# Patient Record
Sex: Male | Born: 1945 | Race: White | Hispanic: No | Marital: Married | Smoking: Former smoker
Health system: Southern US, Community
[De-identification: ages and names within clinical notes are randomized; demographics above are authoritative.]

## PROBLEM LIST (undated history)

## (undated) DIAGNOSIS — E782 Mixed hyperlipidemia: Secondary | ICD-10-CM

## (undated) DIAGNOSIS — D369 Benign neoplasm, unspecified site: Secondary | ICD-10-CM

## (undated) DIAGNOSIS — N179 Acute kidney failure, unspecified: Secondary | ICD-10-CM

## (undated) DIAGNOSIS — I10 Essential (primary) hypertension: Secondary | ICD-10-CM

## (undated) DIAGNOSIS — Z87448 Personal history of other diseases of urinary system: Secondary | ICD-10-CM

## (undated) DIAGNOSIS — I2510 Atherosclerotic heart disease of native coronary artery without angina pectoris: Secondary | ICD-10-CM

## (undated) DIAGNOSIS — J449 Chronic obstructive pulmonary disease, unspecified: Secondary | ICD-10-CM

## (undated) HISTORY — DX: Personal history of other diseases of urinary system: Z87.448

## (undated) HISTORY — PX: CHOLECYSTECTOMY: SHX55

## (undated) HISTORY — PX: TONSILLECTOMY AND ADENOIDECTOMY: SHX28

## (undated) HISTORY — DX: Mixed hyperlipidemia: E78.2

## (undated) HISTORY — DX: Acute kidney failure, unspecified: N17.9

## (undated) HISTORY — PX: VASECTOMY: SHX75

## (undated) HISTORY — DX: Benign neoplasm, unspecified site: D36.9

## (undated) HISTORY — DX: Chronic obstructive pulmonary disease, unspecified: J44.9

## (undated) HISTORY — DX: Atherosclerotic heart disease of native coronary artery without angina pectoris: I25.10

## (undated) HISTORY — DX: Essential (primary) hypertension: I10

## (undated) HISTORY — PX: LAPAROSCOPIC APPENDECTOMY: SUR753

---
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
## 2005-03-17 ENCOUNTER — Ambulatory Visit: Payer: Self-pay | Admitting: Internal Medicine

## 2005-03-22 ENCOUNTER — Ambulatory Visit: Payer: Self-pay | Admitting: Cardiology

## 2005-04-01 ENCOUNTER — Ambulatory Visit: Payer: Self-pay | Admitting: Internal Medicine

## 2005-04-06 ENCOUNTER — Ambulatory Visit: Payer: Self-pay | Admitting: Cardiology

## 2005-04-06 ENCOUNTER — Ambulatory Visit (HOSPITAL_COMMUNITY): Admission: RE | Admit: 2005-04-06 | Discharge: 2005-04-07 | Payer: Self-pay | Admitting: Cardiology

## 2005-04-06 ENCOUNTER — Inpatient Hospital Stay (HOSPITAL_BASED_OUTPATIENT_CLINIC_OR_DEPARTMENT_OTHER): Admission: RE | Admit: 2005-04-06 | Discharge: 2005-04-06 | Payer: Self-pay | Admitting: Cardiology

## 2005-04-22 ENCOUNTER — Ambulatory Visit: Payer: Self-pay | Admitting: Cardiology

## 2005-05-13 ENCOUNTER — Ambulatory Visit: Payer: Self-pay | Admitting: Cardiology

## 2009-12-15 ENCOUNTER — Encounter: Payer: Self-pay | Admitting: Internal Medicine

## 2009-12-16 ENCOUNTER — Telehealth (INDEPENDENT_AMBULATORY_CARE_PROVIDER_SITE_OTHER): Payer: Self-pay

## 2009-12-29 ENCOUNTER — Ambulatory Visit: Payer: Self-pay | Admitting: Internal Medicine

## 2009-12-29 ENCOUNTER — Ambulatory Visit (HOSPITAL_COMMUNITY): Admission: RE | Admit: 2009-12-29 | Discharge: 2009-12-29 | Payer: Self-pay | Admitting: Internal Medicine

## 2009-12-30 ENCOUNTER — Encounter: Payer: Self-pay | Admitting: Internal Medicine

## 2010-10-26 NOTE — Letter (Signed)
Summary: Patient Notice, Colon Biopsy Results  [facility]  [address]   [place], [place] [address]   Phone: [phone]  Fax: [phone]       December 30, 2009   [facility] [place] [address] [place], [place]  [address] 10/01/1945    Dear Hector Daniels,  I am pleased to inform you that the biopsies taken during your recent colonoscopy did not show any evidence of cancer upon pathologic examination.  Additional information/recommendations:  You should have a repeat colonoscopy examination  in 7 years.  Please call us if you are having persistent problems or have questions about your condition that have not been fully answered at this time.  Sincerely,    R. Roetta Sessions MD, FACP [facility] Ph: [phone]    Fax: [phone]   Appended Document: Patient Notice, Colon Biopsy Results Letter mailed to pt.

## 2010-10-26 NOTE — Progress Notes (Signed)
Summary: reviewed TCS instructions  Phone Note Call from Patient   Caller: Patient Summary of Call: Pt called and i reviewed instructions with him, that had been faxed to [facility]. Initial call taken by: Cloria Spring LPN,  December 16, 2009 9:33 AM

## 2010-10-26 NOTE — Letter (Signed)
Summary: Internal Other/triage  Internal Other/triage   Imported By: Cloria Spring LPN 62/95/2841 32:44:01  _____________________________________________________________________  External Attachment:    Type:   Image     Comment:   External Document

## 2011-02-11 NOTE — Cardiovascular Report (Signed)
Hector, Daniels NO.:  [id number]   MEDICAL RECORD NO.:  [id number]          PATIENT TYPE:  OIB   LOCATION:  6501                         FACILITY:  [facility]   PHYSICIAN:  Jonelle Sidle, M.D. LHCDATE OF BIRTH:  Jun 18, 1946   DATE OF PROCEDURE:  04/06/2005  DATE OF DISCHARGE:                              CARDIAC CATHETERIZATION   REQUESTING CARDIOLOGIST:  Dr. Arvilla Meres.   PRIMARY CARE PHYSICIAN:  Dr. Wyvonnia Lora, [place], [place].   INDICATIONS:  Hector Daniels is a pleasant 65 year old male with a history of  hypertension, hyperlipidemia, remote tobacco use and recent complaints of  exertional chest discomfort.  He underwent an exercise Cardiolite on June 27  which was abnormal both electrocardiographically and also by perfusion  imaging showing a large inferolateral defect that was essentially fixed with  a reversible periapical defect in the septal and anterolateral distribution.  Ejection fraction was 55%.  He is now referred for diagnostic coronary  angiography to evaluate for revascularization strategies.   PROCEDURES PERFORMED:  1.  Left heart catheterization.  2.  Selective coronary angiography.  3.  Left ventriculography.   ACCESS AND EQUIPMENT:  The area about the right femoral artery was  anesthetized with 1% lidocaine and a 4-French sheath was placed in the right  femoral artery via modified Seldinger technique.  Standard preformed 4-  [place] JL-4 and JR-4 catheters were used for selective coronary angiography  and an angled pigtail catheter was used for left heart catheterization and  left ventriculography.  All exchanges were made over a wire and the patient  tolerated procedure well without immediate complications.   HEMODYNAMICS:  Left ventricle 157/20 mmHg, aorta 156/72 mmHg.   ANGIOGRAPHIC FINDINGS:  1.  The left main coronary artery exhibits approximately 40% narrowing      distally which also effects the ostial portion of  the left anterior      descending.  2.  The left anterior descending is a medium caliber vessel with two      diagonal branches, the first of which is large.  There is approximately      40-50% stenosis in the proximal to midportion of the left anterior      descending also involving the ostium of the first diagonal branch.  In      the distal to apical portion of the left anterior descending there are      focal 50% and 75-80% stenoses.  These are predominantly affecting the      apical portion of this vessel.  3.  The circumflex coronary artery is medium in caliber.  There are two      obtuse marginal branches, the second of which is larger and bifurcates.      In the proximal vessel there is approximately 50-60% stenosis.  The      first obtuse marginal branch has approximately 50-60% stenosis at its      ostium.  In the mid circumflex there is 30% stenosis.  4.  The right coronary artery is a relatively large caliber vessel that is  dominant.  In the proximal portion of the vessel there is a 75% focal      stenosis.  The distal portion of the vessel has a 90% focal stenosis.      Other more minor luminal irregularities are also noted.   Left ventriculography was performed in the RAO projection and revealed an  ejection fraction of approximately 55% with mild inferior basal hypokinesis  and no significant mitral regurgitation.   DIAGNOSES:  1.  Multivessel coronary atherosclerosis as outlined, most significant in      the right coronary artery with focal proximal 75% and distal 90%      stenoses.  The left system has less severe stenoses.  The most      significant involved the apical left anterior descending.  2.  Left ventricular ejection fraction approximately 55% with mild inferior      basal hypokinesis, no significant mitral regurgitation and a left      ventricle end-diastolic pressure of 20 mmHg.   DISCUSSION:  I reviewed the films with Dr. Riley Kill and discussed the   situation with the patient and his family.   PLAN:  At this point will be to proceed with percutaneous intervention  likely with stent placement to the proximal and distal right coronary  artery, anticipating medical therapy for the left system disease for the  time being.        SGM/MEDQ  D:  04/06/2005  T:  04/06/2005  Job:  [id number]   cc:   Arvilla Meres, M.D. [facility]   Wyvonnia Lora, M.D.

## 2011-02-11 NOTE — Cardiovascular Report (Signed)
Hector Daniels, Hector Daniels                  ACCOUNT NO.:  [id number]   MEDICAL RECORD NO.:  [id number]          PATIENT TYPE:  OIB   LOCATION:  6532                         FACILITY:  [facility]   PHYSICIAN:  Arturo Morton. Riley Kill, M.D. [facility] OF BIRTH:  May 06, 1946   DATE OF PROCEDURE:  DATE OF DISCHARGE:                              CARDIAC CATHETERIZATION   INDICATIONS:  Hector Daniels is a 64 year old gentleman with exertional angina.  He underwent catheterization by Dr. Diona Browner.  This demonstrated a fairly  high-grade tandem lesions of the right coronary artery.  There is scattered  disease of the left which is outlined in the catheterization report.  With  review of the films percutaneous intervention of the RCA with continued  medical therapy for the remaining disease was felt to be indicated.  Risks,  benefits, and alternatives were discussed with the patient and his family in  detail prior to the procedure.   PROCEDURE:  Percutaneous stenting of the right coronary artery x2.   DESCRIPTION OF PROCEDURE:  The patient was brought to the catheterization  laboratory from the holding area, prepped and draped in usual fashion.  The  4-French sheath was exchanged for a 6-French sheath by the technologists.  The patient had an ACT checked and subsequently was given bivalirudin  according to protocol.  ACT was checked and found to be appropriate for  intervention.  JR4 guiding catheter with side holes was then utilized.  We  crossed into the distal lesion across both lesions using a high-torque  floppy wire.  The distal stenosis was dilated with a 2.5 x 12 Quantum  Maverick balloon.  There was improvement in the appearance of the artery.  This was then stented using a 2.75 x 16 TAXUS drug-eluting stent.  We were  careful not to extend more proximally into an area of segmental plaquing,  and the stent was taken up to about 14 atmospheres.  Post dilatation with a  3 mm balloon throughout the course of the  stent was then performed using a  PowerSail.  The PowerSail was then used to pre dilate the mid lesion.  Following pre dilatation the mid lesion was then stented with a 3 x 16 TAXUS  drug-eluting stent.  We elected not to use a 3.5 because of the segmental  nature the plaque proximal to the greatest area of stenosis.  This was taken  up to also 14 atmospheres.  We then put in a 3.75 PowerSail balloon 8 mm in  length.  This was taken to the distal end of the stent and taken up to about  14 atmospheres.  It was then brought to the middle the stent were a second  dilatation was performed and the proximal portion of the stent where a third  dilatation was performed at slightly lower pressures.  Following this,  follow-up views were obtained.  We went back in and dilated the middle of  the stent with a 14 atmosphere inflation.  The balloon was then removed.  Final views were obtained.  I then reviewed pictures with the patient's  family  in detail.   ANGIOGRAPHIC DATA:  The right coronary artery demonstrates a 70% hazy-  appearing stenosis in the mid portion of the vessel.  Following the acute  margin there is an area of segmental plaquing of about 30-40%.  There is  then a 95% focal stenosis.  The posterolateral branch has about a 50% area  of narrowing.   The 95% distal focal stenosis was reduced to 0%.  The edges appeared to be  excellent without any evidence of edge tear.  The mid stenosis was 70% and  hazy and was reduced to 0% with an excellent angiographic appearance and no  evidence of edge tear.   RECOMMENDATIONS:  1.  Continue clopidogrel for one year and aspirin indefinitely thereafter.  2.  Follow up with Dr. Diona Browner and Dr. Margo Common in [place].  3.  Aggressive medical therapy.  4.  I have discussed the patient's use of smokeless tobacco in some detail      with him, explained the mechanisms of its impact on vascular disease,      and explained to him why we thought that it would be  wise for him to      stop this.       TDS/MEDQ  D:  04/06/2005  T:  04/06/2005  Job:  [id number]   cc:   CV Lab   Hector Daniels, M.D. [facility]  [address]  [place]  [place] [address]  Fax: [phone]

## 2011-02-11 NOTE — Discharge Summary (Signed)
Hector Daniels, Hector Daniels                  ACCOUNT NO.:  [id number]   MEDICAL RECORD NO.:  [id number]          PATIENT TYPE:  OIB   LOCATION:  6532                         FACILITY:  [facility]   PHYSICIAN:  Arturo Morton. Riley Kill, M.D. [facility] OF BIRTH:  07-Aug-1946   DATE OF ADMISSION:  04/06/2005  DATE OF DISCHARGE:  04/07/2005                                 DISCHARGE SUMMARY   PROCEDURES:  1.  Cardiac catheterization.  2.  Coronary arteriogram.  3.  Left ventriculogram.  4.  Recatheterization with percutaneous intervention and Taxus stent x 2 to      one vessel.   DISCHARGE DIAGNOSES:  1.  Exertional chest pain, status post catheterization showing 75% mid and      90% distal right coronary artery treated with two Taxus stents and      multivessel nonobstructive disease, medical therapy.  2.  Preserved left ventricular function with an ejection fraction of 55% at      catheterization.  3.  Hypertension.  4.  Hyperlipidemia.  5.  Ongoing tobacco use with chewing tobacco.  6.  A 25-pack-year history of tobacco use.  7.  Obesity.  8.  Status post vasectomy and tonsillectomy.   HOSPITAL COURSE:  Hector Daniels this is a 65 year old male with multiple  cardiac risk factors, who had a stress test on March 22, 2005. He was seen in  the office after that because of a large inferolateral defect, as well as a  periapical defect. The periapical defect had reversibility and it was felt  that he needed a heart catheterization. He was admitted for this on April 06, 2005.   The cardiac catheterization showed 75% mid RCA and 90% distal RCA. This was  treated with Taxus stents x 2. The circumflex, OM and LAD had between 30-60%  stenosis with 75% distal LAD seen as well. This is to be treated medically.   The next day, Hector Daniels was ambulating without chest pain or shortness of  breath. He was encouraged to quit chewing tobacco and said he would do so. A  nutrition consult was ordered, as well as cardiac  rehabilitation. Hector Daniels  is pending evaluation by Dr. Riley Kill, but tentatively considered stable for  discharge on April 07, 2005.   DISCHARGE INSTRUCTIONS:  1.  His activity level is to be per the cardiac catheterization discharge      sheet.  2.  He is to call our office for problems with the catheterization site.  3.  He is to stick to a low-fat diet.  4.  He is to follow up with Dr. Margo Common as needed.  5.  He has an appointment with Hector Conners. Julious Oka., for Dr. Gala Romney on      April 19, 2005, at 10:45 a.m.   DISCHARGE MEDICATIONS:  1.  Plavix 75 mg daily.  2.  Aspirin 325 mg daily.  3.  Toprol XL 50 mg daily.  4.  Crestor 10 mg daily.  5.  Lisinopril 20 mg daily.  6.  Prilosec 20 mg daily.  7.  Nitroglycerin sublingual  p.r.n.  8.  Isosorbide as prior to admission.       RB/MEDQ  D:  04/07/2005  T:  04/07/2005  Job:  [id number]   cc:   [facility]  [place], [place]   Wyvonnia Lora  [address]  [place]  [place] [address]  Fax: [phone]

## 2012-09-13 ENCOUNTER — Encounter: Payer: Self-pay | Admitting: Cardiology

## 2012-09-14 ENCOUNTER — Encounter: Payer: Self-pay | Admitting: Cardiology

## 2012-09-28 ENCOUNTER — Encounter: Payer: Self-pay | Admitting: Cardiology

## 2012-10-08 ENCOUNTER — Encounter: Payer: Self-pay | Admitting: Cardiology

## 2012-10-17 ENCOUNTER — Encounter: Payer: Self-pay | Admitting: Cardiology

## 2012-10-18 ENCOUNTER — Telehealth: Payer: Self-pay | Admitting: Cardiology

## 2012-10-18 ENCOUNTER — Encounter: Payer: Self-pay | Admitting: *Deleted

## 2012-10-18 ENCOUNTER — Ambulatory Visit (INDEPENDENT_AMBULATORY_CARE_PROVIDER_SITE_OTHER): Payer: Medicare Other | Admitting: Cardiology

## 2012-10-18 ENCOUNTER — Encounter: Payer: Self-pay | Admitting: Cardiology

## 2012-10-18 ENCOUNTER — Other Ambulatory Visit: Payer: Self-pay | Admitting: *Deleted

## 2012-10-18 VITALS — BP 187/107 | HR 69 | Ht 70.0 in | Wt 209.8 lb

## 2012-10-18 DIAGNOSIS — I10 Essential (primary) hypertension: Secondary | ICD-10-CM

## 2012-10-18 DIAGNOSIS — Z87448 Personal history of other diseases of urinary system: Secondary | ICD-10-CM

## 2012-10-18 DIAGNOSIS — I429 Cardiomyopathy, unspecified: Secondary | ICD-10-CM

## 2012-10-18 DIAGNOSIS — I2510 Atherosclerotic heart disease of native coronary artery without angina pectoris: Secondary | ICD-10-CM

## 2012-10-18 DIAGNOSIS — E782 Mixed hyperlipidemia: Secondary | ICD-10-CM

## 2012-10-18 MED ORDER — HYDRALAZINE HCL 25 MG PO TABS: 25.0000 mg | ORAL_TABLET | Freq: Three times a day (TID) | ORAL | Status: DC

## 2012-10-18 NOTE — Assessment & Plan Note (Signed)
History of DES x 2 to the RCA in 2006 with moderate left system disease that was managed medically at that time. Main question at this point with associated cardiomyopathy is whether he has manifested significant obstruction over the years. Followup testing arranged.

## 2012-10-18 NOTE — Progress Notes (Signed)
Clinical Summary Mr. Hector Daniels is a 67 y.o.male referred to the office by Dr. Margo Common for evaluation of known CAD and documentation of cardiomyopathy. He was seen by our practice in the past, status post intervention to the RCA in 2006 as detailed below.  Recent echocardiogram from 10/08/12 revealed LVEF 40-45% with inferior wall motion abnormality consistent with ischemic cardiomyopathy, trace tricuspid regurgitation, RVSP 25 mm mercury. LV function had been normal around the time of his intervention in 2006.  He is significantly hypertensive today, I note that he was taken off ACE inhibitor and diuretic in December in the setting of acute renal insufficiency (prerenal), subsequently normalized creatinine to 0.9. He states that he has started to feel more weak with activity back in November, also intermittent cough, decreased appetite. His renal function improved with hydration, he has followup with Dr. Kristian Covey soon for followup BMET. He has gone back to his walking regimen, only about a mile at a time indoors. Prior to that he was walking 3 miles at a time outdoors. He denies any obvious angina symptoms.  No followup ischemic testing noted since cardiac catheterization in 2006.  Allergies  Allergen Reactions  . Crestor (Rosuvastatin)     Right hip pain and weakness  . Tessalon (Benzonatate)     Weird feeling     Current Outpatient Prescriptions  Medication Sig Dispense Refill  . aspirin 325 MG tablet Take 325 mg by mouth daily.      . clopidogrel (PLAVIX) 75 MG tablet Take 75 mg by mouth daily.      . metoprolol (LOPRESSOR) 50 MG tablet Take 75 mg by mouth 2 (two) times daily.      . Multiple Vitamin (MULTIVITAMIN) capsule Take 1 capsule by mouth daily.      . ranitidine (ZANTAC) 75 MG tablet Take 150 mg by mouth 2 (two) times daily.      . sildenafil (VIAGRA) 100 MG tablet Take 100 mg by mouth daily as needed.      . simvastatin (ZOCOR) 40 MG tablet Take 40 mg by mouth every evening.       . hydrALAZINE (APRESOLINE) 25 MG tablet Take 1 tablet (25 mg total) by mouth 3 (three) times daily.  270 tablet  3    Past Medical History  Diagnosis Date  . Essential hypertension, benign   . Mixed hyperlipidemia   . Coronary atherosclerosis of native coronary artery     Moderate multivessel, s/p DES x 2 RCA 2006  . Tubular adenoma   . History of hematuria   . Acute renal failure     Episode 12/13, prerenal, ACE inhibitor and diuretic discontinued    Past Surgical History  Procedure Date  . Tonsillectomy and adenoidectomy   . Vasectomy   . Laparoscopic appendectomy     Family History  Problem Relation Age of Onset  . Melanoma Father     Cause of death, age 63  . Heart disease Mother     Died age 97  . Hyperlipidemia Brother     Social History Mr. Glazier reports that he quit smoking about 8 years ago. His smoking use included Cigarettes. He quit after 10 years of use. He does not have any smokeless tobacco history on file. Mr. Filip reports that he drinks alcohol.  Review of Systems No palpitations or syncope. No recent fevers or chills, no active cough. Mild ankle edema. Appetite has been good. Normal bowel pattern. No orthopnea or PND. Otherwise negative.  Physical Examination Filed  Vitals:   10/18/12 0844  BP: 187/107  Pulse:    Filed Weights   10/18/12 0836  Weight: 209 lb 12.8 oz (95.165 kg)   Overweight male in no acute distress. HEENT: Conjunctiva and lids normal, oropharynx clear with moist mucosa. Neck: Supple, no elevated JVP or carotid bruits, no thyromegaly. Lungs: Clear to auscultation, nonlabored breathing at rest. Cardiac: Regular rate and rhythm, no S3 or significant systolic murmur, no pericardial rub. Abdomen: Soft, nontender, bowel sounds present, no guarding or rebound. Extremities: Trace edema, distal pulses 2+. Skin: Warm and dry. Musculoskeletal: No kyphosis. Neuropsychiatric: Alert and oriented x3, affect grossly  appropriate.   Problem List and Plan   Secondary cardiomyopathy LVEF down to the range of 40-45% with inferior wall motion abnormality consistent with ischemic cardiomyopathy. LV function had been normal around the time of his intervention to the RCA in 2006. He has had some functional limitations over the last few months although not clear if he had a viral syndrome associated. He has otherwise been compliant with medical therapy and typically tries to stay active via walking. His blood pressure is significantly elevated, although he was taken off ACE inhibitor and diuretic in December. Plan is to add hydralazine 25 mg 3 times a day for now, and arrange a Lexiscan Cardiolite on medical therapy to assess ischemic burden. Office followup will be arranged to review.  History of acute renal failure Pending followup visit with Dr. Kristian Covey. He was felt to be prerenal, creatinine normalized with hydration. Not clear whether he is to stay off ACE inhibitor indefinitely. If ACE inhibitor or ARB could be resumed, I would place hydralazine with it.  Mixed hyperlipidemia On statin therapy, followed by Dr. Margo Common.  Essential hypertension, benign For now we are adding hydralazine 25 mg 3 times a day until issue of ACE inhibitor or ARB is clarified.  Coronary atherosclerosis of native coronary artery History of DES x 2 to the RCA in 2006 with moderate left system disease that was managed medically at that time. Main question at this point with associated cardiomyopathy is whether he has manifested significant obstruction over the years. Followup testing arranged.    Jonelle Sidle, M.D., F.A.C.C.

## 2012-10-18 NOTE — Patient Instructions (Addendum)
Your physician recommends that you schedule a follow-up appointment in: 1 month. Your physician has recommended you make the following change in your medication: Start hydralazine 25 mg by mouth three times daily. Your new prescription has been sent to your pharmacy. All other medications will remain the same. Your physician has requested that you have a lexiscan myoview. For further information please visit [email or web address] Please follow instruction sheet, as given.

## 2012-10-18 NOTE — Assessment & Plan Note (Signed)
For now we are adding hydralazine 25 mg 3 times a day until issue of ACE inhibitor or ARB is clarified.

## 2012-10-18 NOTE — Assessment & Plan Note (Signed)
On statin therapy, followed by Dr. Margo Common.

## 2012-10-18 NOTE — Assessment & Plan Note (Signed)
Pending followup visit with Dr. Kristian Covey. He was felt to be prerenal, creatinine normalized with hydration. Not clear whether he is to stay off ACE inhibitor indefinitely. If ACE inhibitor or ARB could be resumed, I would place hydralazine with it.

## 2012-10-18 NOTE — Telephone Encounter (Signed)
Lexiscan Cardiolite on meds  Diagnosis: 414.01, 425.4 Scheduled 10-25-12 [facility] checking percert

## 2012-10-18 NOTE — Assessment & Plan Note (Signed)
LVEF down to the range of 40-45% with inferior wall motion abnormality consistent with ischemic cardiomyopathy. LV function had been normal around the time of his intervention to the RCA in 2006. He has had some functional limitations over the last few months although not clear if he had a viral syndrome associated. He has otherwise been compliant with medical therapy and typically tries to stay active via walking. His blood pressure is significantly elevated, although he was taken off ACE inhibitor and diuretic in December. Plan is to add hydralazine 25 mg 3 times a day for now, and arrange a Lexiscan Cardiolite on medical therapy to assess ischemic burden. Office followup will be arranged to review.

## 2012-10-22 NOTE — Telephone Encounter (Signed)
Pt has Blue MCR.  ZOXW#[id number] expires 11-20-12

## 2012-10-25 DIAGNOSIS — I2510 Atherosclerotic heart disease of native coronary artery without angina pectoris: Secondary | ICD-10-CM

## 2012-10-30 ENCOUNTER — Telehealth: Payer: Self-pay | Admitting: *Deleted

## 2012-10-30 NOTE — Telephone Encounter (Signed)
Message copied by Eustace Moore on Tue Oct 30, 2012 11:42 AM ------      Message from: MCDOWELL, Illene Bolus      Created: Fri Oct 26, 2012 12:13 PM       Study shows no definite ischemia, possible scar in the inferior wall versus attenuation. Interestingly, LVEF is in the normal range by this study at 59%. I expect that we can continue medical therapy. He should already have a followup arranged.

## 2012-10-30 NOTE — Telephone Encounter (Signed)
Patient informed. 

## 2012-11-30 ENCOUNTER — Ambulatory Visit: Payer: Medicare Other | Admitting: Cardiology

## 2013-01-21 ENCOUNTER — Encounter: Payer: Self-pay | Admitting: Cardiology

## 2013-01-21 ENCOUNTER — Ambulatory Visit (INDEPENDENT_AMBULATORY_CARE_PROVIDER_SITE_OTHER): Payer: Medicare Other | Admitting: Cardiology

## 2013-01-21 VITALS — BP 157/72 | HR 68 | Ht 70.0 in | Wt 203.0 lb

## 2013-01-21 DIAGNOSIS — I2510 Atherosclerotic heart disease of native coronary artery without angina pectoris: Secondary | ICD-10-CM

## 2013-01-21 DIAGNOSIS — I10 Essential (primary) hypertension: Secondary | ICD-10-CM

## 2013-01-21 DIAGNOSIS — E782 Mixed hyperlipidemia: Secondary | ICD-10-CM

## 2013-01-21 DIAGNOSIS — I429 Cardiomyopathy, unspecified: Secondary | ICD-10-CM

## 2013-01-21 NOTE — Assessment & Plan Note (Signed)
Blood pressure trend is better. Continue current regimen.

## 2013-01-21 NOTE — Assessment & Plan Note (Signed)
Symptomatically stable on medical therapy with history of previous intervention to the RCA as outlined. Followup Cardiolite in January demonstrated inferior scar with no active ischemia, LVEF was 59% at that point. Plan to continue medical therapy and observation. Six-month followup arranged.

## 2013-01-21 NOTE — Assessment & Plan Note (Signed)
LVEF by Cardiolite in January was 59%. Continue medical therapy.

## 2013-01-21 NOTE — Assessment & Plan Note (Signed)
Keep followup with Dr. Margo Common. He continues on Zocor. Goal LDL should be under 100, closer to 70 if possible.

## 2013-01-21 NOTE — Progress Notes (Signed)
Clinical Summary Hector Daniels is a 67 y.o.male last seen in January. He states that he has been doing well, back to walking 3 miles at a time, 5 days a week. He reports no angina symptoms, stable NYHA class II dyspnea. He has maintained followup with nephrology, states that things have been improving.  Lexiscan Cardiolite in January demonstrated no diagnostic ST segment changes with evidence of inferior scar, no active ischemia, LVEF 59%. We discussed this today. Plan will be to continue medical therapy for now.  Blood pressure trend is much better, he is tolerating hydralazine.   Allergies  Allergen Reactions  . Crestor (Rosuvastatin)     Right hip pain and weakness  . Tessalon (Benzonatate)     Weird feeling     Current Outpatient Prescriptions  Medication Sig Dispense Refill  . aspirin 325 MG tablet Take 325 mg by mouth daily.      . clopidogrel (PLAVIX) 75 MG tablet Take 75 mg by mouth daily.      . hydrALAZINE (APRESOLINE) 25 MG tablet Take 25 mg by mouth 2 (two) times daily.      . hydrochlorothiazide (MICROZIDE) 12.5 MG capsule Take 1 capsule by mouth daily.      . metoprolol (LOPRESSOR) 50 MG tablet Take 75 mg by mouth 2 (two) times daily.      . Multiple Vitamin (MULTIVITAMIN) capsule Take 1 capsule by mouth daily.      . ranitidine (ZANTAC) 75 MG tablet Take 150 mg by mouth 2 (two) times daily.      . sildenafil (VIAGRA) 100 MG tablet Take 100 mg by mouth daily as needed.      . simvastatin (ZOCOR) 40 MG tablet Take 40 mg by mouth every evening.       No current facility-administered medications for this visit.    Past Medical History  Diagnosis Date  . Essential hypertension, benign   . Mixed hyperlipidemia   . Coronary atherosclerosis of native coronary artery     Moderate multivessel, s/p DES x 2 RCA 2006  . Tubular adenoma   . History of hematuria   . Acute renal failure     Episode 12/13, prerenal, ACE inhibitor and diuretic discontinued    Social  History Hector Daniels reports that he quit smoking about 8 years ago. His smoking use included Cigarettes. He smoked 0.00 packs per day for 10 years. He has never used smokeless tobacco. Hector Daniels reports that  drinks alcohol.  Review of Systems No palpitations, breathlessness, syncope. Stable appetite. No orthopnea or PND. Otherwise negative.  Physical Examination Filed Vitals:   01/21/13 1320  BP: 157/72  Pulse: 68   Filed Weights   01/21/13 1316  Weight: 203 lb (92.08 kg)    Overweight male in no acute distress.  HEENT: Conjunctiva and lids normal, oropharynx clear with moist mucosa.  Neck: Supple, no elevated JVP or carotid bruits, no thyromegaly.  Lungs: Clear to auscultation, nonlabored breathing at rest.  Cardiac: Regular rate and rhythm, no S3 or significant systolic murmur, no pericardial rub.  Abdomen: Soft, nontender, bowel sounds present, no guarding or rebound.  Extremities: Trace edema, distal pulses 2+.  Skin: Warm and dry.  Musculoskeletal: No kyphosis.  Neuropsychiatric: Alert and oriented x3, affect grossly appropriate.   Problem List and Plan   Coronary atherosclerosis of native coronary artery Symptomatically stable on medical therapy with history of previous intervention to the RCA as outlined. Followup Cardiolite in January demonstrated inferior scar with no active  ischemia, LVEF was 59% at that point. Plan to continue medical therapy and observation. Six-month followup arranged.  Essential hypertension, benign Blood pressure trend is better. Continue current regimen.  Mixed hyperlipidemia Keep followup with Dr. Margo Common. He continues on Zocor. Goal LDL should be under 100, closer to 70 if possible.  Secondary cardiomyopathy LVEF by Cardiolite in January was 59%. Continue medical therapy.    Jonelle Sidle, M.D., F.A.C.C.

## 2013-01-21 NOTE — Patient Instructions (Addendum)

## 2013-08-02 ENCOUNTER — Ambulatory Visit: Payer: Medicare Other | Admitting: Cardiology

## 2013-08-02 ENCOUNTER — Ambulatory Visit (INDEPENDENT_AMBULATORY_CARE_PROVIDER_SITE_OTHER): Payer: Medicare Other | Admitting: Cardiology

## 2013-08-02 ENCOUNTER — Encounter: Payer: Self-pay | Admitting: Cardiology

## 2013-08-02 VITALS — BP 184/105 | HR 63 | Ht 70.0 in | Wt 211.0 lb

## 2013-08-02 DIAGNOSIS — E782 Mixed hyperlipidemia: Secondary | ICD-10-CM

## 2013-08-02 DIAGNOSIS — I2510 Atherosclerotic heart disease of native coronary artery without angina pectoris: Secondary | ICD-10-CM

## 2013-08-02 DIAGNOSIS — I429 Cardiomyopathy, unspecified: Secondary | ICD-10-CM

## 2013-08-02 DIAGNOSIS — I10 Essential (primary) hypertension: Secondary | ICD-10-CM

## 2013-08-02 NOTE — Assessment & Plan Note (Signed)
LVEF by Cardiolite in January was normal at 59%.

## 2013-08-02 NOTE — Assessment & Plan Note (Signed)
He continues on Zocor, keep followup with Dr. Margo Common.

## 2013-08-02 NOTE — Progress Notes (Signed)
Clinical Summary Hector Daniels is a 67 y.o.male last seen in April of this year. He reports no angina symptoms. Has been walking 3 miles on the local [place] 5 days a week. Follows with Dr. Kristian Covey for hypertension and renal insufficiency.  Lexiscan Cardiolite in January demonstrated no diagnostic ST segment changes with evidence of inferior scar, no active ischemia, LVEF 59%.  Blood pressure was up significantly today. He reports compliance with his medications, says that he is somewhat nervous when he comes in for an office visit. He does not have a blood pressure cuff at home.   Allergies  Allergen Reactions  . Crestor [Rosuvastatin]     Right hip pain and weakness  . Tessalon [Benzonatate]     Weird feeling     Current Outpatient Prescriptions  Medication Sig Dispense Refill  . aspirin 325 MG tablet Take 325 mg by mouth daily.      . clopidogrel (PLAVIX) 75 MG tablet Take 75 mg by mouth daily.      . hydrALAZINE (APRESOLINE) 50 MG tablet Take 50 mg by mouth 2 (two) times daily.      . hydrochlorothiazide (MICROZIDE) 12.5 MG capsule Take 1 capsule by mouth daily.      . metoprolol (LOPRESSOR) 50 MG tablet Take 75 mg by mouth 2 (two) times daily.      . Multiple Vitamin (MULTIVITAMIN) capsule Take 1 capsule by mouth daily.      . ranitidine (ZANTAC) 75 MG tablet Take 150 mg by mouth 2 (two) times daily.      . sildenafil (VIAGRA) 100 MG tablet Take 100 mg by mouth daily as needed.      . simvastatin (ZOCOR) 40 MG tablet Take 40 mg by mouth every evening.       No current facility-administered medications for this visit.    Past Medical History  Diagnosis Date  . Essential hypertension, benign   . Mixed hyperlipidemia   . Coronary atherosclerosis of native coronary artery     Moderate multivessel, s/p DES x 2 RCA 2006  . Tubular adenoma   . History of hematuria   . Acute renal failure     Episode 12/13, prerenal, ACE inhibitor and diuretic discontinued    Social  History Hector Daniels reports that he quit smoking about 8 years ago. His smoking use included Cigarettes. He smoked 0.00 packs per day for 10 years. He has never used smokeless tobacco. Hector Daniels reports that he drinks alcohol.  Review of Systems No palpitations, dizziness, syncope. No claudication. Stable appetite. Otherwise negative.  Physical Examination Filed Vitals:   08/02/13 1113  BP: 184/105  Pulse: 63   Filed Weights   08/02/13 1109  Weight: 211 lb (95.709 kg)    Overweight male in no acute distress.  HEENT: Conjunctiva and lids normal, oropharynx clear with moist mucosa.  Neck: Supple, no elevated JVP or carotid bruits, no thyromegaly.  Lungs: Clear to auscultation, nonlabored breathing at rest.  Cardiac: Regular rate and rhythm, no S3 or significant systolic murmur, no pericardial rub.  Abdomen: Soft, nontender, bowel sounds present, no guarding or rebound.  Extremities: Trace edema, distal pulses 2+.  Skin: Warm and dry.  Musculoskeletal: No kyphosis.  Neuropsychiatric: Alert and oriented x3, affect grossly appropriate.   Problem List and Plan   Coronary atherosclerosis of native coronary artery Clinically stable, low risk Cardiolite from earlier in the year. Continue medical therapy and observation. I encouraged him to continue his regular walking regimen.  Essential hypertension, benign Blood pressure significantly elevated day. He reports compliance with his medications. I asked him to consider getting a home blood pressure cuff to check his blood pressure trend more regularly. May also need to consider taking hydralazine 3 times a day instead of twice daily. He has pending followup with Dr. Margo Common.  Secondary cardiomyopathy LVEF by Cardiolite in January was normal at 59%.  Mixed hyperlipidemia He continues on Zocor, keep followup with Dr. Margo Common.    Jonelle Sidle, M.D., F.A.C.C.

## 2013-08-02 NOTE — Assessment & Plan Note (Signed)
Clinically stable, low risk Cardiolite from earlier in the year. Continue medical therapy and observation. I encouraged him to continue his regular walking regimen.

## 2013-08-02 NOTE — Assessment & Plan Note (Signed)
Blood pressure significantly elevated day. He reports compliance with his medications. I asked him to consider getting a home blood pressure cuff to check his blood pressure trend more regularly. May also need to consider taking hydralazine 3 times a day instead of twice daily. He has pending followup with Dr. Margo Common.

## 2013-08-02 NOTE — Patient Instructions (Signed)

## 2013-11-25 LAB — PULMONARY FUNCTION TEST

## 2014-01-08 ENCOUNTER — Institutional Professional Consult (permissible substitution): Payer: Medicare Other | Admitting: Pulmonary Disease

## 2014-01-09 ENCOUNTER — Telehealth: Payer: Self-pay | Admitting: Pulmonary Disease

## 2014-01-09 ENCOUNTER — Ambulatory Visit (INDEPENDENT_AMBULATORY_CARE_PROVIDER_SITE_OTHER): Payer: Medicare Other | Admitting: Pulmonary Disease

## 2014-01-09 ENCOUNTER — Encounter: Payer: Self-pay | Admitting: Pulmonary Disease

## 2014-01-09 ENCOUNTER — Ambulatory Visit (INDEPENDENT_AMBULATORY_CARE_PROVIDER_SITE_OTHER)
Admission: RE | Admit: 2014-01-09 | Discharge: 2014-01-09 | Disposition: A | Discharge status: Home / Self Care | Payer: Medicare Other | Source: Ambulatory Visit | Attending: Pulmonary Disease | Admitting: Pulmonary Disease

## 2014-01-09 ENCOUNTER — Other Ambulatory Visit (INDEPENDENT_AMBULATORY_CARE_PROVIDER_SITE_OTHER): Payer: Medicare Other

## 2014-01-09 VITALS — BP 154/92 | HR 70 | Ht 69.0 in | Wt 227.0 lb

## 2014-01-09 DIAGNOSIS — I429 Cardiomyopathy, unspecified: Secondary | ICD-10-CM

## 2014-01-09 DIAGNOSIS — J449 Chronic obstructive pulmonary disease, unspecified: Secondary | ICD-10-CM

## 2014-01-09 DIAGNOSIS — J4489 Other specified chronic obstructive pulmonary disease: Secondary | ICD-10-CM

## 2014-01-09 DIAGNOSIS — R0600 Dyspnea, unspecified: Secondary | ICD-10-CM | POA: Insufficient documentation

## 2014-01-09 DIAGNOSIS — R0609 Other forms of dyspnea: Secondary | ICD-10-CM

## 2014-01-09 DIAGNOSIS — J9610 Chronic respiratory failure, unspecified whether with hypoxia or hypercapnia: Secondary | ICD-10-CM

## 2014-01-09 DIAGNOSIS — G4734 Idiopathic sleep related nonobstructive alveolar hypoventilation: Secondary | ICD-10-CM | POA: Insufficient documentation

## 2014-01-09 DIAGNOSIS — R0989 Other specified symptoms and signs involving the circulatory and respiratory systems: Secondary | ICD-10-CM

## 2014-01-09 DIAGNOSIS — R0902 Hypoxemia: Secondary | ICD-10-CM

## 2014-01-09 DIAGNOSIS — J9611 Chronic respiratory failure with hypoxia: Secondary | ICD-10-CM

## 2014-01-09 LAB — COMPREHENSIVE METABOLIC PANEL
ALBUMIN: 3.9 g/dL (ref 3.5–5.2)
ALK PHOS: 39 U/L (ref 39–117)
ALT: 20 U/L (ref 0–53)
AST: 22 U/L (ref 0–37)
BUN: 15 mg/dL (ref 6–23)
CO2: 34 mEq/L — ABNORMAL HIGH (ref 19–32)
Calcium: 9.1 mg/dL (ref 8.4–10.5)
Chloride: 98 mEq/L (ref 96–112)
Creatinine, Ser: 0.9 mg/dL (ref 0.4–1.5)
GFR: 88.14 mL/min (ref >60.00)
Glucose, Bld: 101 mg/dL — ABNORMAL HIGH (ref 70–99)
POTASSIUM: 4.6 meq/L (ref 3.5–5.1)
SODIUM: 139 meq/L (ref 135–145)
TOTAL PROTEIN: 7.5 g/dL (ref 6.0–8.3)
Total Bilirubin: 0.6 mg/dL (ref 0.3–1.2)

## 2014-01-09 LAB — CBC
HEMATOCRIT: 43.5 % (ref 39.0–52.0)
Hemoglobin: 14.6 g/dL (ref 13.0–17.0)
MCHC: 33.7 g/dL (ref 30.0–36.0)
MCV: 87.3 fl (ref 78.0–100.0)
Platelets: 198 10*3/uL (ref 150.0–400.0)
RBC: 4.98 Mil/uL (ref 4.22–5.81)
RDW: 13.3 % (ref 11.5–14.6)
WBC: 10.2 10*3/uL (ref 4.5–10.5)

## 2014-01-09 MED ORDER — BUDESONIDE-FORMOTEROL FUMARATE 160-4.5 MCG/ACT IN AERO: 2.0000 | INHALATION_SPRAY | Freq: Two times a day (BID) | RESPIRATORY_TRACT | Status: DC

## 2014-01-09 NOTE — Assessment & Plan Note (Signed)
He has severe obstruction on recent spirometry.  Will give him trial of symbicort.  Will check chest xray, and A1AT level.  Will discuss option of pulmonary rehab at next visit.

## 2014-01-09 NOTE — Assessment & Plan Note (Signed)
Advised that he should arrange for follow up with Dr. Domenic Polite with cardiology service.

## 2014-01-09 NOTE — Telephone Encounter (Signed)
Dg Chest 2 View  01/09/2014   CLINICAL DATA:  COPD, dyspnea on exertion  EXAM: CHEST  2 VIEW  COMPARISON:  None.  FINDINGS: The heart size and mediastinal contours are within normal limits. Both lungs are clear. The visualized skeletal structures are unremarkable.  IMPRESSION: No active cardiopulmonary disease.   Electronically Signed   By: Inez Catalina M.D.   On: 01/09/2014 16:24    CMP     Component Value Date/Time   NA 139 01/09/2014 1601   K 4.6 01/09/2014 1601   CL 98 01/09/2014 1601   CO2 34* 01/09/2014 1601   GLUCOSE 101* 01/09/2014 1601   BUN 15 01/09/2014 1601   CREATININE 0.9 01/09/2014 1601   CALCIUM 9.1 01/09/2014 1601   PROT 7.5 01/09/2014 1601   ALBUMIN 3.9 01/09/2014 1601   AST 22 01/09/2014 1601   ALT 20 01/09/2014 1601   ALKPHOS 39 01/09/2014 1601   BILITOT 0.6 01/09/2014 1601    CBC    Component Value Date/Time   WBC 10.2 01/09/2014 1601   RBC 4.98 01/09/2014 1601   HGB 14.6 01/09/2014 1601   HCT 43.5 01/09/2014 1601   PLT 198.0 01/09/2014 1601   MCV 87.3 01/09/2014 1601   MCHC 33.7 01/09/2014 1601   RDW 13.3 01/09/2014 1601    Will have my nurse inform pt that CXR and labs okay.  No change to current treatment plan.

## 2014-01-09 NOTE — Progress Notes (Signed)
Chief Complaint  Patient presents with  . Pulmonary Consult    referred by Dr. Scotty Court    History of Present Illness: Hector Daniels. is a 68 y.o. male former smoker evaluation of dyspnea.  He was having trouble with his breathing in 2006.  He was found to have CAD and then had stents.  His breathing was improved.  He started noticing more trouble with his breathing about 2 years ago.  This has gotten progressively worse.  He is okay at rest, and slow activity.  He gets winded when walking up hills or stairs.  He also notices trouble in hot weather. He gets sinus congestion and has to clear his throat.  He will occasionally hear wheezing in his chest.  He denies history of asthma, but gets hay fever in the Spring and Fall.  He is not using inhaler therapy.  He has not had a chest xray since 2013.  He had spirometry in March 2015 which showed severe obstruction.  He was referred to pulmonary for further assessment of COPD.  He snores while asleep.  He reports having recent sleep study (about 1 year ago).  This was negative for sleep apnea, but did show low oxygen at night.  He is not using oxygen at home.  He is from [place].  He is retired >> worked in Production designer, theatre/television/film as Freight forwarder.  He denies animal exposures.  He started smoking at age 50.  He smoked 1 pack per day, and quit in 1980.  Tests: V/Q 09/13/12 >> airtrapping Echo 10/08/12 >> mild LVH, EF 40 to 45% Spirometry 11/25/13 >> FEV1 0.74, FEV1% 35 Ambulatory oximetry RA 01/09/14 >> SpO2 80% after 1 lap  Hector Daniels.  has a past medical history of Essential hypertension, benign; Mixed hyperlipidemia; Coronary atherosclerosis of native coronary artery; Tubular adenoma; History of hematuria; and Acute renal failure.  Hector Daniels.  has past surgical history that includes Tonsillectomy and adenoidectomy; Vasectomy; and Laparoscopic appendectomy.  Prior to Admission medications   Medication Sig Start Date End Date Taking?  Authorizing Provider  aspirin 325 MG tablet Take 325 mg by mouth daily.   Yes Historical Provider, MD  clopidogrel (PLAVIX) 75 MG tablet Take 75 mg by mouth daily.   Yes Historical Provider, MD  fluticasone (FLONASE) 50 MCG/ACT nasal spray Place 1 spray into both nostrils daily. 12/30/13  Yes Historical Provider, MD  hydrALAZINE (APRESOLINE) 50 MG tablet Take 50 mg by mouth 3 (three) times daily.    Yes Historical Provider, MD  hydrochlorothiazide (MICROZIDE) 12.5 MG capsule Take 1 capsule by mouth daily. 01/16/13  Yes Historical Provider, MD  loratadine (CLARITIN) 10 MG tablet Take 10 mg by mouth daily.   Yes Historical Provider, MD  metoprolol (LOPRESSOR) 50 MG tablet Take 75 mg by mouth 2 (two) times daily.   Yes Historical Provider, MD  Multiple Vitamin (MULTIVITAMIN) capsule Take 1 capsule by mouth daily.   Yes Historical Provider, MD  ranitidine (ZANTAC) 75 MG tablet Take 150 mg by mouth 2 (two) times daily.   Yes Historical Provider, MD  sildenafil (VIAGRA) 100 MG tablet Take 100 mg by mouth daily as needed.   Yes Historical Provider, MD  simvastatin (ZOCOR) 40 MG tablet Take 40 mg by mouth every evening.   Yes Historical Provider, MD    Allergies  Allergen Reactions  . Crestor [Rosuvastatin]     Right hip pain and weakness  . Tessalon [Benzonatate]  Weird feeling     His family history includes Heart disease in his mother; Hyperlipidemia in his brother; Melanoma in his father.  He  reports that he quit smoking about 35 years ago. His smoking use included Cigarettes. He started smoking about 48 years ago. He has a 13 pack-year smoking history. He has never used smokeless tobacco. He reports that he drinks alcohol. He reports that he does not use illicit drugs.  Review of Systems  Constitutional: Positive for unexpected weight change. Negative for fever, chills, diaphoresis, activity change, appetite change and fatigue.  HENT: Positive for congestion. Negative for dental problem,  ear discharge, ear pain, facial swelling, hearing loss, mouth sores, nosebleeds, postnasal drip, rhinorrhea, sinus pressure, sneezing, sore throat, tinnitus, trouble swallowing and voice change.   Eyes: Negative for photophobia, discharge, itching and visual disturbance.  Respiratory: Positive for shortness of breath. Negative for apnea, cough, choking, chest tightness, wheezing and stridor.   Cardiovascular: Negative for chest pain, palpitations and leg swelling.  Gastrointestinal: Negative for nausea, vomiting, abdominal pain, constipation, blood in stool and abdominal distention.  Genitourinary: Negative for dysuria, urgency, frequency, hematuria, flank pain, decreased urine volume and difficulty urinating.  Musculoskeletal: Negative for arthralgias, back pain, gait problem, joint swelling, myalgias, neck pain and neck stiffness.  Skin: Negative for color change, pallor and rash.  Neurological: Negative for dizziness, tremors, seizures, syncope, speech difficulty, weakness, light-headedness, numbness and headaches.  Hematological: Negative for adenopathy. Does not bruise/bleed easily.  Psychiatric/Behavioral: Negative for confusion, sleep disturbance and agitation. The patient is not nervous/anxious.    Physical Exam:  General - No distress ENT - No sinus tenderness, no oral exudate, no LAN, no thyromegaly, TM clear, pupils equal/reactive, MP 3, scalloped tongue Cardiac - s1s2 regular, no murmur, pulses symmetric Chest - decreased breath sounds, scattered rhonchi, no wheeze Back - No focal tenderness Abd - Soft, non-tender, no organomegaly, + bowel sounds Ext - No edema Neuro - Normal strength, cranial nerves intact Skin - No rashes Psych - Normal mood, and behavior  Assessment/Plan:  Hector Mires, MD  Pulmonary/Critical Care/Sleep Pager:  [phone]

## 2014-01-09 NOTE — Assessment & Plan Note (Addendum)
He had significant oxygen desaturation with exertion in office today.  Will arrange for 2 liters oxygen with exertion and sleep.  Will get copy of his recent sleep study.  Will arrange for overnight oximetry on room air to qualify him to use supplemental oxygen at night.  He is scheduled to go on a cruise next month >> I explained to him that he needs to check with the cruise line about how to arrange for oxygen while on ship.  Also discussed needing to check with airline before flying with oxygen.

## 2014-01-09 NOTE — Assessment & Plan Note (Addendum)
This is likely multifactorial.  He has severe COPD with chronic bronchitis.  He also has significant oxygen desaturation on exertion and sleep.  He has systolic heart failure and hx of CAD.  He likely also has a component of deconditioning.

## 2014-01-09 NOTE — Patient Instructions (Addendum)
Lab test and chest xray today Will arrange for 2 liters oxygen to use with exertion and sleep Will arrange for overnight oxygen test Symbicort two puffs twice per day >> rinse mouth after each use Will get copy of sleep study from Dr. Rayna Sexton office Follow up in 6 weeks

## 2014-01-09 NOTE — Progress Notes (Deleted)
   Subjective:    Patient ID: Hector Daniels., male    DOB: October 14, 1945, 68 y.o.   MRN: [id number]  HPI    Review of Systems  Constitutional: Positive for unexpected weight change. Negative for fever, chills, diaphoresis, activity change, appetite change and fatigue.  HENT: Positive for congestion. Negative for dental problem, ear discharge, ear pain, facial swelling, hearing loss, mouth sores, nosebleeds, postnasal drip, rhinorrhea, sinus pressure, sneezing, sore throat, tinnitus, trouble swallowing and voice change.   Eyes: Negative for photophobia, discharge, itching and visual disturbance.  Respiratory: Positive for shortness of breath. Negative for apnea, cough, choking, chest tightness, wheezing and stridor.   Cardiovascular: Negative for chest pain, palpitations and leg swelling.  Gastrointestinal: Negative for nausea, vomiting, abdominal pain, constipation, blood in stool and abdominal distention.  Genitourinary: Negative for dysuria, urgency, frequency, hematuria, flank pain, decreased urine volume and difficulty urinating.  Musculoskeletal: Negative for arthralgias, back pain, gait problem, joint swelling, myalgias, neck pain and neck stiffness.  Skin: Negative for color change, pallor and rash.  Neurological: Negative for dizziness, tremors, seizures, syncope, speech difficulty, weakness, light-headedness, numbness and headaches.  Hematological: Negative for adenopathy. Does not bruise/bleed easily.  Psychiatric/Behavioral: Negative for confusion, sleep disturbance and agitation. The patient is not nervous/anxious.        Objective:   Physical Exam        Assessment & Plan:

## 2014-01-10 NOTE — Telephone Encounter (Signed)
Pt is aware of results. 

## 2014-01-14 ENCOUNTER — Telehealth: Payer: Self-pay | Admitting: Pulmonary Disease

## 2014-01-14 NOTE — Telephone Encounter (Signed)
Hector Daniels w/ [facility] returned call.  Hector Daniels

## 2014-01-14 NOTE — Telephone Encounter (Signed)
LMTCBx1 for resp therapy department at [facility].

## 2014-01-14 NOTE — Telephone Encounter (Signed)
Called Cory from [facility]. She wants to know if pt had ONO done already. This was ordered 01/09/14. Per Tommi Rumps they can't do ONO and also supply pt for O2. Although they never received ONO order. I can;t tell where this was sent. Please advise PCC's thanks

## 2014-01-15 LAB — ALPHA-1 ANTITRYPSIN PHENOTYPE: A1 ANTITRYPSIN: 161 mg/dL (ref 83–199)

## 2014-01-16 NOTE — Telephone Encounter (Signed)
Called and spoke with pt and he is aware that we will send order to [facility] for the ONO to be done by them.  Pt voiced his understanding.  Waiting for [facility] [place] to call back.

## 2014-01-16 NOTE — Telephone Encounter (Addendum)
I spoke with the pt  He is asking for status of his ONO order, stating that this is urgent b/c he is going out of town  Looks like order was originally sent to [facility], but then had to be sent to [facility] I can not see where it has been documented that it was indeed faxed to [facility] though  Suanne Marker, has this been done? Please advise thanks!

## 2014-01-16 NOTE — Telephone Encounter (Signed)
I spoke with Suanne Marker to see where ONO order was sent and it was sent to [facility], however message states they did not receive it. I also spoke with Suanne Marker about the message stating they cannot provide oxygen and do ONO. She states she spoke with someone there before she sent the order and was advised they could do this. She will send ONO order to [facility]. I spoke with the pt and advised. He already gets oxygen with exertion and sleep through [facility]. I am not sure how they provided this without the ONO results. I advised the pt that the ONO test needs to be done and order will be sent to [facility]. Pt states understanding. I have LMTCBx1 with Tommi Rumps at [facility] to discuss.

## 2014-01-16 NOTE — Telephone Encounter (Signed)
Patient requesting to speak to nurse about ono.  [phone]

## 2014-01-16 NOTE — Telephone Encounter (Signed)
Pt is returning call & can be reached at [phone].  Hector Daniels

## 2014-01-17 ENCOUNTER — Telehealth: Payer: Self-pay | Admitting: Pulmonary Disease

## 2014-01-17 DIAGNOSIS — J449 Chronic obstructive pulmonary disease, unspecified: Secondary | ICD-10-CM

## 2014-01-17 NOTE — Telephone Encounter (Signed)
I faxed this to [facility] 01/16/14 which is documented in the scheduled order report. I have called and left a message for Maudie Mercury to return my call this morning to check when patient will be contacted to arrange this study. Rhonda J Cobb

## 2014-01-17 NOTE — Telephone Encounter (Signed)
Will have my nurse inform pt that lab test for inherited emphysema was normal.  He does not have inherited form of emphysema.  A1AT 01/09/14 >> 161, PI-MM

## 2014-01-17 NOTE — Telephone Encounter (Signed)
Order has been placed and pt aware. He wants this to be done today. Please advise pcc's thanks

## 2014-01-17 NOTE — Telephone Encounter (Signed)
Pt leaving 01/29/14 for a Cruise. Pt calling requesting a POC for travel--Needs Rx for this sent to [facility].  This needs to be ASAP d/t [facility] having to order the supply. Pt requesting the small one that can be plugged and runs off battery.  Please advise Dr Halford Chessman.  (Dr Halford Chessman paged--- see msg on [place], [place]. 04-09-1946 Needs Rx [facility] for POC)

## 2014-01-17 NOTE — Telephone Encounter (Signed)
Okay to send order for POC.  Never received page.  Please confirm you have my correct pager number.  Pager:  [phone]

## 2014-01-20 ENCOUNTER — Encounter: Payer: Self-pay | Admitting: *Deleted

## 2014-01-20 NOTE — Telephone Encounter (Signed)
Pt is aware of results. 

## 2014-01-21 ENCOUNTER — Telehealth: Payer: Self-pay | Admitting: Pulmonary Disease

## 2014-01-21 DIAGNOSIS — J9611 Chronic respiratory failure with hypoxia: Secondary | ICD-10-CM

## 2014-01-21 NOTE — Telephone Encounter (Signed)
Pt is aware of ONO results. Order will be placed to update [facility] in [place] about qhs use of oxygen.

## 2014-01-21 NOTE — Telephone Encounter (Signed)
ONO with [facility] 01/17/14 >> test time 6 hrs 43 min.  Baseline SpO2 92%, low SpO2 82%.  Spent 14 min with SpO2 < 88%.  Will have my nurse inform pt that his ONO shows low oxygen levels while asleep.  He needs to use 2 liters oxygen while asleep, and continue using 2 liters oxygen with activity during the day.

## 2014-01-22 NOTE — Telephone Encounter (Signed)
Pt is already aware of ONO results. See telephone encounter from 01/21/14.

## 2014-01-22 NOTE — Telephone Encounter (Signed)
ONO was done by [facility] on 01/20/14. Maudie Mercury will fax another copy to the triage fax. Rhonda J Cobb

## 2014-01-22 NOTE — Telephone Encounter (Signed)
Rhonda did Kim call you back on this pt? [place] Bing, CMA

## 2014-02-13 ENCOUNTER — Encounter: Payer: Self-pay | Admitting: Pulmonary Disease

## 2014-02-13 ENCOUNTER — Ambulatory Visit (INDEPENDENT_AMBULATORY_CARE_PROVIDER_SITE_OTHER): Payer: Medicare Other | Admitting: Pulmonary Disease

## 2014-02-13 VITALS — BP 132/80 | HR 63 | Temp 97.6°F | Ht 69.0 in | Wt 230.0 lb

## 2014-02-13 DIAGNOSIS — J9610 Chronic respiratory failure, unspecified whether with hypoxia or hypercapnia: Secondary | ICD-10-CM

## 2014-02-13 DIAGNOSIS — R0609 Other forms of dyspnea: Secondary | ICD-10-CM

## 2014-02-13 DIAGNOSIS — J449 Chronic obstructive pulmonary disease, unspecified: Secondary | ICD-10-CM

## 2014-02-13 DIAGNOSIS — R0989 Other specified symptoms and signs involving the circulatory and respiratory systems: Secondary | ICD-10-CM

## 2014-02-13 DIAGNOSIS — J9611 Chronic respiratory failure with hypoxia: Secondary | ICD-10-CM

## 2014-02-13 DIAGNOSIS — R0902 Hypoxemia: Secondary | ICD-10-CM

## 2014-02-13 DIAGNOSIS — J4489 Other specified chronic obstructive pulmonary disease: Secondary | ICD-10-CM

## 2014-02-13 DIAGNOSIS — R0600 Dyspnea, unspecified: Secondary | ICD-10-CM

## 2014-02-13 MED ORDER — ALBUTEROL SULFATE HFA 108 (90 BASE) MCG/ACT IN AERS: 2.0000 | INHALATION_SPRAY | Freq: Four times a day (QID) | RESPIRATORY_TRACT | Status: DC | PRN

## 2014-02-13 NOTE — Patient Instructions (Signed)
Proair two puffs up to four times per day as needed Will arrange for referral to pulmonary rehab Follow up in 4 months

## 2014-02-13 NOTE — Progress Notes (Signed)
Chief Complaint  Patient presents with  . Follow-up    Reports good tolerance of Symbicort. Breathing is much improved since last OV. Using O2 hs and with exertional activities.     History of Present Illness: Hector Daniels. is a 68 y.o. male former smoker with dyspnea from COPD, systolic CHF, and CAD  His breathing has improved.  He feels symbicort has helped.  He is using oxygen, and feels he is able to do more activity.  He would like referral to pulmonary rehab in [place] if possible.  TESTS: V/Q 09/13/12 >> airtrapping  Echo 10/08/12 >> mild LVH, EF 40 to 45%  Spirometry 11/25/13 >> FEV1 0.74, FEV1% 35  Ambulatory oximetry RA 01/09/14 >> SpO2 80% after 1 lap A1AT 01/09/14 >> 161, PI-MM ONO with RA 01/17/14 >> test time 6 hrs 43 min. Baseline SpO2 92%, low SpO2 82%. Spent 14 min with SpO2 < 88%.   Hector Daniels.  has a past medical history of Essential hypertension, benign; Mixed hyperlipidemia; Coronary atherosclerosis of native coronary artery; Tubular adenoma; History of hematuria; and Acute renal failure.  Hector Daniels.  has past surgical history that includes Tonsillectomy and adenoidectomy; Vasectomy; and Laparoscopic appendectomy.  Prior to Admission medications   Medication Sig Start Date End Date Taking? Authorizing Provider  aspirin 325 MG tablet Take 325 mg by mouth daily.   Yes Historical Provider, MD  budesonide-formoterol (SYMBICORT) 160-4.5 MCG/ACT inhaler Inhale 2 puffs into the lungs 2 (two) times daily. 01/09/14  Yes Chesley Mires, MD  clopidogrel (PLAVIX) 75 MG tablet Take 75 mg by mouth daily.   Yes Historical Provider, MD  fluticasone (FLONASE) 50 MCG/ACT nasal spray Place 1 spray into both nostrils daily. 12/30/13  Yes Historical Provider, MD  hydrALAZINE (APRESOLINE) 50 MG tablet Take 50 mg by mouth 3 (three) times daily.    Yes Historical Provider, MD  hydrochlorothiazide (MICROZIDE) 12.5 MG capsule Take 1 capsule by mouth daily. 01/16/13  Yes Historical Provider,  MD  loratadine (CLARITIN) 10 MG tablet Take 10 mg by mouth daily.   Yes Historical Provider, MD  metoprolol (LOPRESSOR) 50 MG tablet Take 75 mg by mouth 2 (two) times daily.   Yes Historical Provider, MD  Multiple Vitamin (MULTIVITAMIN) capsule Take 1 capsule by mouth daily.   Yes Historical Provider, MD  ranitidine (ZANTAC) 75 MG tablet Take 150 mg by mouth 2 (two) times daily.   Yes Historical Provider, MD  sildenafil (VIAGRA) 100 MG tablet Take 100 mg by mouth daily as needed.   Yes Historical Provider, MD  simvastatin (ZOCOR) 40 MG tablet Take 40 mg by mouth every evening.   Yes Historical Provider, MD    Allergies  Allergen Reactions  . Crestor [Rosuvastatin]     Right hip pain and weakness  . Tessalon [Benzonatate]     Weird feeling      Physical Exam:  General - No distress ENT - No sinus tenderness, no oral exudate, no LAN Cardiac - s1s2 regular, no murmur Chest - No wheeze/rales/dullness Back - No focal tenderness Abd - Soft, non-tender Ext - No edema Neuro - Normal strength Skin - No rashes Psych - normal mood, and behavior   Assessment/Plan:  Chesley Mires, MD [facility]/Critical Care/Sleep Pager:  [phone]

## 2014-02-21 ENCOUNTER — Telehealth (HOSPITAL_COMMUNITY): Payer: Self-pay

## 2014-02-21 NOTE — Telephone Encounter (Signed)
Contacted patient to inquire about attending Pulmonary Rehab at [facility].  Patient is interested but lives closer to [facility].  I faxed over the patients information to [place].

## 2014-02-25 NOTE — Assessment & Plan Note (Signed)
Continue symbicort and prn albuterol.  Will try to arrange for pulmonary rehab at [facility].

## 2014-02-25 NOTE — Assessment & Plan Note (Signed)
Related to COPD.  He is to continue 2 liters oxygen with exertion and sleep.

## 2014-02-25 NOTE — Assessment & Plan Note (Signed)
Related to COPD, systolic CHF, hypoxic respiratory failure, and deconditioning.  Improved since last visit.

## 2014-02-27 ENCOUNTER — Encounter (HOSPITAL_COMMUNITY)
Admission: RE | Admit: 2014-02-27 | Discharge: 2014-02-27 | Disposition: A | Discharge status: Home / Self Care | Payer: Medicare Other | Source: Ambulatory Visit | Attending: Pulmonary Disease | Admitting: Pulmonary Disease

## 2014-02-27 VITALS — BP 130/80 | HR 62 | Ht 69.0 in | Wt 225.4 lb

## 2014-02-27 DIAGNOSIS — Z5189 Encounter for other specified aftercare: Secondary | ICD-10-CM | POA: Insufficient documentation

## 2014-02-27 DIAGNOSIS — J438 Other emphysema: Secondary | ICD-10-CM | POA: Insufficient documentation

## 2014-02-27 NOTE — Patient Instructions (Signed)
Pt has finished orientation and is scheduled to start PR on 03/03/14 at 2:30pm. Pt has been instructed to arrive to class 15 minutes early for scheduled class. Pt has been instructed to wear comfortable clothing and shoes with rubber soles. Pt has been told to take their medications 1 hour prior to coming to class.  If the patient is not going to attend class, he/she has been instructed to call.

## 2014-02-27 NOTE — Progress Notes (Signed)
Patient was referred by Dr. Halford Chessman due to Emphysema 492.8. During orientation advised patient on arrival and appointment times what to wear, what to do before, during and after exercise. Reviewed attendance and class policy. Talked about inclement weather and class consultation policy. Pt is scheduled to start Pulm Rehab on 03/03/14 at 2:30. Pt was advised to come to class 5 minutes before class starts. He was also given instructions on meeting with the dietician and attending the Family Structure classes. Pt is eager to get started. Patient was able to complete 6 minute walk test w/o the use of the wheelchair as an assistive device or the use of O2. Discussed pursed lip breathing also gave handouts to demonstrate techniques.

## 2014-03-03 ENCOUNTER — Encounter (HOSPITAL_COMMUNITY)
Admission: RE | Admit: 2014-03-03 | Discharge: 2014-03-03 | Disposition: A | Discharge status: Home / Self Care | Payer: Medicare Other | Source: Ambulatory Visit | Attending: Pulmonary Disease | Admitting: Pulmonary Disease

## 2014-03-05 ENCOUNTER — Encounter (HOSPITAL_COMMUNITY)
Admission: RE | Admit: 2014-03-05 | Discharge: 2014-03-05 | Disposition: A | Discharge status: Home / Self Care | Payer: Medicare Other | Source: Ambulatory Visit | Attending: Pulmonary Disease | Admitting: Pulmonary Disease

## 2014-03-10 ENCOUNTER — Encounter (HOSPITAL_COMMUNITY)
Admission: RE | Admit: 2014-03-10 | Discharge: 2014-03-10 | Disposition: A | Discharge status: Home / Self Care | Payer: Medicare Other | Source: Ambulatory Visit | Attending: Pulmonary Disease | Admitting: Pulmonary Disease

## 2014-03-12 ENCOUNTER — Encounter (HOSPITAL_COMMUNITY)
Admission: RE | Admit: 2014-03-12 | Discharge: 2014-03-12 | Disposition: A | Discharge status: Home / Self Care | Payer: Medicare Other | Source: Ambulatory Visit | Attending: Pulmonary Disease | Admitting: Pulmonary Disease

## 2014-03-17 ENCOUNTER — Encounter (HOSPITAL_COMMUNITY)
Admission: RE | Admit: 2014-03-17 | Discharge: 2014-03-17 | Disposition: A | Discharge status: Home / Self Care | Payer: Medicare Other | Source: Ambulatory Visit | Attending: Pulmonary Disease | Admitting: Pulmonary Disease

## 2014-03-19 ENCOUNTER — Encounter (HOSPITAL_COMMUNITY)
Admission: RE | Admit: 2014-03-19 | Discharge: 2014-03-19 | Disposition: A | Discharge status: Home / Self Care | Payer: Medicare Other | Source: Ambulatory Visit | Attending: Pulmonary Disease | Admitting: Pulmonary Disease

## 2014-03-24 ENCOUNTER — Encounter (HOSPITAL_COMMUNITY)
Admission: RE | Admit: 2014-03-24 | Discharge: 2014-03-24 | Disposition: A | Discharge status: Home / Self Care | Payer: Medicare Other | Source: Ambulatory Visit | Attending: Pulmonary Disease | Admitting: Pulmonary Disease

## 2014-03-26 ENCOUNTER — Encounter (HOSPITAL_COMMUNITY)
Admission: RE | Admit: 2014-03-26 | Discharge: 2014-03-26 | Disposition: A | Discharge status: Home / Self Care | Payer: Medicare Other | Source: Ambulatory Visit | Attending: Pulmonary Disease | Admitting: Pulmonary Disease

## 2014-03-26 DIAGNOSIS — Z5189 Encounter for other specified aftercare: Secondary | ICD-10-CM | POA: Diagnosis present

## 2014-03-26 DIAGNOSIS — J438 Other emphysema: Secondary | ICD-10-CM | POA: Diagnosis not present

## 2014-03-28 NOTE — Progress Notes (Signed)
[facility] Pulmonary Rehabilitation Baseline Outcomes Assessment   Anthropometrics:    Height (inches): 69   Weight (kg): 102.2   Grip strength was measured using a Dynamometer.  The patient's highest score was a 79.  Functional Status/Exercise Capacity:   Hector Daniels had a resting heart rate of 62 BPM, a resting blood pressure of 130/80, and an oxygen saturation of 98 % on 2 liters of O2 at night only.  Hector Daniels performed a 6-minute walk test on 0 liters of O2.  The patient completed 1150 feet in 6 minutes with 1 rest breaks.  This quantifies 2.7 METS.   Dyspnea Measures:   The [facility] is a simple and standardized method of classifying disability in patients with COPD.  The assessment correlates disability and dyspnea.  At entrance the patient scored a 3. The scale is provided below.   0= I only get breathless with strenuous exercise. 1= I get short of breath when hurrying on level ground or walking up a slight incline. 2= On level ground, I walk slower than people of the same age because of breathlessness, or have to stop for breath when walking at my own pace. 3= I stop for breath after walking 100 yards or after a few minutes on level ground. 4=I am too breathless to leave the house or I am breathless when dressing.     The patient completed the [place] ([facility]).  This questionnaire relates activities of daily living and shortness of breath.  The score ranges from 0-120, a higher score relates to severe shortness of breath during activities of daily living. The patient's score at entrance was 28.  Quality of Life:   Ferrans and Powers Quality of Life Index Pulmonary Version is used to assess the patients satisfaction in different domains of their life; health and functioning, socioeconomic, psychological/spiritual, and family. The overall score is recorded out of 30 points.  The patient's goal is to achieve an overall score of 21 or  higher.  Hector Daniels received a 20.69 at entrance.    The Patient Health Questionnaire (PHQ-2) is a first step approach for the screening of depression.  If the patient scores positive on the PHQ-2 the patient should be further assessed with the PHQ-9.  The Patient Health Questionnaire (PHQ-9) assesses the degree of depression.  Depression is important to monitor and track in pulmonary patients due to its prevalence in the population.  If the patient advances to the PHQ-9 the goal is to score less than 4 on this assessment.  Hector Daniels scored a 0 at entrance.  Clinical Assessment Tools:   The COPD Assessment Test (CAT) is a measurement tool to quantify how much of an impact the disease has on the patient's life.  This assessment aids the Pulmonary Rehab Team in designing the patients individualized treatment plan.  A CAT score ranges from 0-40.  A score of 10 or below indicates that COPD has a low impact on the patient's life whereas a score of 30 or higher indicates a severe impact. The patient's goal is a decrease of 1 point from entrance to discharge.  Hector Daniels had a CAT score of 13 at entrance.  Nutrition:   The "Rate My Plate" is a dietary assessment that quantifies the balance of a patient's diet.  This tool allows the Pulmonary Rehab Team to key in on the areas of the patient's diet that needs improving.  The team can then focus their nutritional education  on those areas.  If the patient scores 24-40, this means there are many ways they can make their eating habits healthier, 41-57 states that there are some ways they can make their eating habits healthier and a score of 58-72 states that they are making many healthy choices.  The patient's goal is to achieve a score of 49 or higher on this assessment.  Hector Daniels scored a 8 at entrance.  Oxygen Compliance:   Patient is currently on 0 liters at rest, 2 liters at night, and 0 liters for exercise.  Hector Daniels is currently not using a cpap/bipap at night.  The patient is currently  neither compliant/noncompliant.  The patient states that they do not have barriers that keep them from using their oxygen.  These barriers include none.   Education:   Hector Daniels will attend education classes during the course of Pulmonary Rehab.  Education classes that will be offered to the patient are Activities of Daily Living and Energy Conservation, Pursed Lip Breathing and Diaphragmatic Breathing, Nutrition, Exercise for the Pulmonary Patient, Warning Signs of Infection, Chronic Lung Disease, Advanced Directives, Medications, and Stress and Meditation.  The patient completed an assessment at the entrance of the program and will complete it again upon discharge to demonstrate the level of understanding provided by the educational classes.  This assessment includes 14 questions regarding all of the education topics above.  Hector Daniels achieved a score of 11/14 at entrance.  Smoking Cessation:  Patient is currently not smoking. Quit in 2006.  Exercise:  Hector Daniels will be provided with an individualized Home Exercise Prescription (HEP) at the entrance of the program.  The patient will be followed by the Pulmonary Exercise Physiologist throughout the program to assist with the progression of the frequency, intensity, time, and type of exercise. The patient's long-term goal is to be exercising 30-60 minutes, 3-5 days per week. At entrance, the patient was exercising 5 days at home.      Dr. Jamesetta So Koneswaran________________    Date Signed________________

## 2014-03-28 NOTE — Addendum Note (Signed)
Encounter addended by: Gean Maidens on: 03/28/2014  1:54 PM<BR>     Documentation filed: Notes Section

## 2014-03-31 ENCOUNTER — Encounter (HOSPITAL_COMMUNITY)
Admission: RE | Admit: 2014-03-31 | Discharge: 2014-03-31 | Disposition: A | Discharge status: Home / Self Care | Payer: Medicare Other | Source: Ambulatory Visit | Attending: Pulmonary Disease | Admitting: Pulmonary Disease

## 2014-03-31 DIAGNOSIS — Z5189 Encounter for other specified aftercare: Secondary | ICD-10-CM | POA: Diagnosis not present

## 2014-04-02 ENCOUNTER — Encounter (HOSPITAL_COMMUNITY)
Admission: RE | Admit: 2014-04-02 | Discharge: 2014-04-02 | Disposition: A | Discharge status: Home / Self Care | Payer: Medicare Other | Source: Ambulatory Visit | Attending: Pulmonary Disease | Admitting: Pulmonary Disease

## 2014-04-02 DIAGNOSIS — Z5189 Encounter for other specified aftercare: Secondary | ICD-10-CM | POA: Diagnosis not present

## 2014-04-07 ENCOUNTER — Encounter (HOSPITAL_COMMUNITY)
Admission: RE | Admit: 2014-04-07 | Discharge: 2014-04-07 | Disposition: A | Discharge status: Home / Self Care | Payer: Medicare Other | Source: Ambulatory Visit | Attending: Pulmonary Disease | Admitting: Pulmonary Disease

## 2014-04-07 DIAGNOSIS — Z5189 Encounter for other specified aftercare: Secondary | ICD-10-CM | POA: Diagnosis not present

## 2014-04-09 ENCOUNTER — Encounter (HOSPITAL_COMMUNITY)
Admission: RE | Admit: 2014-04-09 | Discharge: 2014-04-09 | Disposition: A | Discharge status: Home / Self Care | Payer: Medicare Other | Source: Ambulatory Visit | Attending: Pulmonary Disease | Admitting: Pulmonary Disease

## 2014-04-09 DIAGNOSIS — Z5189 Encounter for other specified aftercare: Secondary | ICD-10-CM | POA: Diagnosis not present

## 2014-04-14 ENCOUNTER — Encounter (HOSPITAL_COMMUNITY)
Admission: RE | Admit: 2014-04-14 | Discharge: 2014-04-14 | Disposition: A | Discharge status: Home / Self Care | Payer: Medicare Other | Source: Ambulatory Visit | Attending: Pulmonary Disease | Admitting: Pulmonary Disease

## 2014-04-14 DIAGNOSIS — Z5189 Encounter for other specified aftercare: Secondary | ICD-10-CM | POA: Diagnosis not present

## 2014-04-16 ENCOUNTER — Encounter (HOSPITAL_COMMUNITY)
Admission: RE | Admit: 2014-04-16 | Discharge: 2014-04-16 | Disposition: A | Discharge status: Home / Self Care | Payer: Medicare Other | Source: Ambulatory Visit | Attending: Pulmonary Disease | Admitting: Pulmonary Disease

## 2014-04-16 DIAGNOSIS — Z5189 Encounter for other specified aftercare: Secondary | ICD-10-CM | POA: Diagnosis not present

## 2014-04-21 ENCOUNTER — Encounter (HOSPITAL_COMMUNITY)
Admission: RE | Admit: 2014-04-21 | Discharge: 2014-04-21 | Disposition: A | Discharge status: Home / Self Care | Payer: Medicare Other | Source: Ambulatory Visit | Attending: Pulmonary Disease | Admitting: Pulmonary Disease

## 2014-04-21 DIAGNOSIS — Z5189 Encounter for other specified aftercare: Secondary | ICD-10-CM | POA: Diagnosis not present

## 2014-04-23 ENCOUNTER — Encounter (HOSPITAL_COMMUNITY)
Admission: RE | Admit: 2014-04-23 | Discharge: 2014-04-23 | Disposition: A | Discharge status: Home / Self Care | Payer: Medicare Other | Source: Ambulatory Visit | Attending: Pulmonary Disease | Admitting: Pulmonary Disease

## 2014-04-23 DIAGNOSIS — Z5189 Encounter for other specified aftercare: Secondary | ICD-10-CM | POA: Diagnosis not present

## 2014-04-28 ENCOUNTER — Encounter (HOSPITAL_COMMUNITY)
Admission: RE | Admit: 2014-04-28 | Discharge: 2014-04-28 | Disposition: A | Discharge status: Home / Self Care | Payer: Medicare Other | Source: Ambulatory Visit | Attending: Pulmonary Disease | Admitting: Pulmonary Disease

## 2014-04-28 DIAGNOSIS — J438 Other emphysema: Secondary | ICD-10-CM | POA: Diagnosis not present

## 2014-04-28 DIAGNOSIS — Z5189 Encounter for other specified aftercare: Secondary | ICD-10-CM | POA: Diagnosis not present

## 2014-04-30 ENCOUNTER — Encounter (HOSPITAL_COMMUNITY)
Admission: RE | Admit: 2014-04-30 | Discharge: 2014-04-30 | Disposition: A | Discharge status: Home / Self Care | Payer: Medicare Other | Source: Ambulatory Visit | Attending: Pulmonary Disease | Admitting: Pulmonary Disease

## 2014-04-30 DIAGNOSIS — Z5189 Encounter for other specified aftercare: Secondary | ICD-10-CM | POA: Diagnosis not present

## 2014-05-05 ENCOUNTER — Encounter (HOSPITAL_COMMUNITY)
Admission: RE | Admit: 2014-05-05 | Discharge: 2014-05-05 | Disposition: A | Discharge status: Home / Self Care | Payer: Medicare Other | Source: Ambulatory Visit | Attending: Pulmonary Disease | Admitting: Pulmonary Disease

## 2014-05-05 DIAGNOSIS — Z5189 Encounter for other specified aftercare: Secondary | ICD-10-CM | POA: Diagnosis not present

## 2014-05-07 ENCOUNTER — Encounter (HOSPITAL_COMMUNITY)
Admission: RE | Admit: 2014-05-07 | Discharge: 2014-05-07 | Disposition: A | Discharge status: Home / Self Care | Payer: Medicare Other | Source: Ambulatory Visit | Attending: Pulmonary Disease | Admitting: Pulmonary Disease

## 2014-05-07 DIAGNOSIS — Z5189 Encounter for other specified aftercare: Secondary | ICD-10-CM | POA: Diagnosis not present

## 2014-05-12 ENCOUNTER — Encounter (HOSPITAL_COMMUNITY)
Admission: RE | Admit: 2014-05-12 | Discharge: 2014-05-12 | Disposition: A | Discharge status: Home / Self Care | Payer: Medicare Other | Source: Ambulatory Visit | Attending: Pulmonary Disease | Admitting: Pulmonary Disease

## 2014-05-12 DIAGNOSIS — Z5189 Encounter for other specified aftercare: Secondary | ICD-10-CM | POA: Diagnosis not present

## 2014-05-14 ENCOUNTER — Encounter (HOSPITAL_COMMUNITY)
Admission: RE | Admit: 2014-05-14 | Discharge: 2014-05-14 | Disposition: A | Discharge status: Home / Self Care | Payer: Medicare Other | Source: Ambulatory Visit | Attending: Pulmonary Disease | Admitting: Pulmonary Disease

## 2014-05-14 DIAGNOSIS — Z5189 Encounter for other specified aftercare: Secondary | ICD-10-CM | POA: Diagnosis not present

## 2014-05-19 ENCOUNTER — Encounter (HOSPITAL_COMMUNITY)
Admission: RE | Admit: 2014-05-19 | Discharge: 2014-05-19 | Disposition: A | Discharge status: Home / Self Care | Payer: Medicare Other | Source: Ambulatory Visit | Attending: Pulmonary Disease | Admitting: Pulmonary Disease

## 2014-05-19 DIAGNOSIS — Z5189 Encounter for other specified aftercare: Secondary | ICD-10-CM | POA: Diagnosis not present

## 2014-05-21 ENCOUNTER — Encounter (HOSPITAL_COMMUNITY)
Admission: RE | Admit: 2014-05-21 | Discharge: 2014-05-21 | Disposition: A | Discharge status: Home / Self Care | Payer: Medicare Other | Source: Ambulatory Visit | Attending: Pulmonary Disease | Admitting: Pulmonary Disease

## 2014-05-21 DIAGNOSIS — Z5189 Encounter for other specified aftercare: Secondary | ICD-10-CM | POA: Diagnosis not present

## 2014-05-26 ENCOUNTER — Encounter (HOSPITAL_COMMUNITY): Payer: Medicare Other

## 2014-05-28 ENCOUNTER — Encounter (HOSPITAL_COMMUNITY): Payer: Medicare Other

## 2014-05-28 ENCOUNTER — Encounter: Payer: Self-pay | Admitting: Pulmonary Disease

## 2014-06-02 ENCOUNTER — Encounter (HOSPITAL_COMMUNITY): Payer: Medicare Other

## 2014-06-04 ENCOUNTER — Encounter (HOSPITAL_COMMUNITY): Payer: Medicare Other

## 2014-06-09 ENCOUNTER — Encounter (HOSPITAL_COMMUNITY): Payer: Medicare Other

## 2014-06-11 ENCOUNTER — Encounter (HOSPITAL_COMMUNITY): Payer: Medicare Other

## 2014-06-13 ENCOUNTER — Ambulatory Visit (INDEPENDENT_AMBULATORY_CARE_PROVIDER_SITE_OTHER): Payer: Medicare Other | Admitting: Pulmonary Disease

## 2014-06-13 ENCOUNTER — Encounter: Payer: Self-pay | Admitting: Pulmonary Disease

## 2014-06-13 VITALS — BP 120/80 | HR 66 | Temp 97.1°F | Ht 69.0 in | Wt 227.6 lb

## 2014-06-13 DIAGNOSIS — J9610 Chronic respiratory failure, unspecified whether with hypoxia or hypercapnia: Secondary | ICD-10-CM

## 2014-06-13 DIAGNOSIS — J449 Chronic obstructive pulmonary disease, unspecified: Secondary | ICD-10-CM

## 2014-06-13 DIAGNOSIS — Z23 Encounter for immunization: Secondary | ICD-10-CM

## 2014-06-13 DIAGNOSIS — J9611 Chronic respiratory failure with hypoxia: Secondary | ICD-10-CM

## 2014-06-13 DIAGNOSIS — R0902 Hypoxemia: Secondary | ICD-10-CM

## 2014-06-13 NOTE — Progress Notes (Signed)
Chief Complaint  Patient presents with  . Follow-up    feels better then last visit    History of Present Illness: Hector Daniels. is a 68 y.o. male former smoker with dyspnea from COPD, systolic CHF, and CAD  His breathing has been doing better.  He does not need to use albuterol.  He feels symbicort has helped.  He did well with pulmonary rehab.  He is only using oxygen at night now.  He is not having cough, wheeze, sputum, chest pain, or leg swelling.  TESTS: V/Q 09/13/12 >> airtrapping  Echo 10/08/12 >> mild LVH, EF 40 to 45%  Spirometry 11/25/13 >> FEV1 0.74, FEV1% 35  Ambulatory oximetry RA 01/09/14 >> SpO2 80% after 1 lap A1AT 01/09/14 >> 161, PI-MM ONO with RA 01/17/14 >> test time 6 hrs 43 min. Baseline SpO2 92%, low SpO2 82%. Spent 14 min with SpO2 < 88%.  PMHx, PSHx, Medications, Allergies, Fhx, Shx reviewed.  Physical Exam:  General - No distress ENT - No sinus tenderness, no oral exudate, no LAN Cardiac - s1s2 regular, no murmur Chest - No wheeze/rales/dullness Back - No focal tenderness Abd - Soft, non-tender Ext - No edema Neuro - Normal strength Skin - No rashes Psych - normal mood, and behavior   Assessment/Plan:  Hector Mires, MD [place] Pulmonary/Critical Care/Sleep Pager:  [phone]

## 2014-06-13 NOTE — Assessment & Plan Note (Signed)
From COPD.  Continue 2 liters oxygen at night.  Will re-assess home oxygen need at next visit.

## 2014-06-13 NOTE — Assessment & Plan Note (Signed)
Much improved.  Continue symbicort with prn albuterol.  Flu shot today.

## 2014-06-13 NOTE — Patient Instructions (Signed)
Flu shot today Follow up in 6 months 

## 2014-06-16 ENCOUNTER — Encounter (HOSPITAL_COMMUNITY): Payer: Medicare Other

## 2014-06-18 ENCOUNTER — Encounter (HOSPITAL_COMMUNITY): Payer: Medicare Other

## 2014-06-23 ENCOUNTER — Encounter (HOSPITAL_COMMUNITY): Payer: Medicare Other

## 2014-06-25 ENCOUNTER — Encounter (HOSPITAL_COMMUNITY): Payer: Medicare Other

## 2014-06-30 ENCOUNTER — Encounter (HOSPITAL_COMMUNITY): Payer: Medicare Other

## 2014-07-02 ENCOUNTER — Encounter (HOSPITAL_COMMUNITY): Payer: Medicare Other

## 2014-08-28 ENCOUNTER — Telehealth: Payer: Self-pay | Admitting: Pulmonary Disease

## 2014-08-28 MED ORDER — BUDESONIDE-FORMOTEROL FUMARATE 160-4.5 MCG/ACT IN AERO: 2.0000 | INHALATION_SPRAY | Freq: Two times a day (BID) | RESPIRATORY_TRACT | Status: DC

## 2014-08-28 NOTE — Telephone Encounter (Signed)
RX has been sent in. Nothing further needed 

## 2014-09-02 ENCOUNTER — Other Ambulatory Visit: Payer: Self-pay | Admitting: Pulmonary Disease

## 2014-10-16 ENCOUNTER — Telehealth: Payer: Self-pay | Admitting: Pulmonary Disease

## 2014-10-16 NOTE — Telephone Encounter (Signed)
Spoke with pt spouse. She reports thay mailed pt assistance forms to Dr. Halford Chessman about 2-3 weeks ago and is checking on the status of them. Please advise asthyn thanks

## 2014-10-16 NOTE — Telephone Encounter (Signed)
Form was mailed this morning back to patient assistance company today. Copy of form placed in scan folder to be scanned into patient chart.  Pt aware. Nothing further needed.

## 2014-12-04 ENCOUNTER — Telehealth: Payer: Self-pay | Admitting: Pulmonary Disease

## 2014-12-04 MED ORDER — AZITHROMYCIN 250 MG PO TABS: ORAL_TABLET | ORAL | Status: AC

## 2014-12-04 NOTE — Telephone Encounter (Signed)
Spoke with pt's wife, Exie Parody. Reports pt having a chest cold for 1 month. Cough, congestion, wheezing and SOB are present. Denies chest tightness. Mucus is yellow in color. Would like recs.  Allergies  Allergen Reactions  . Crestor [Rosuvastatin]     Right hip pain and weakness  . Tessalon [Benzonatate]     Weird feeling     CY - please advise as VS is on night shift. Thanks.

## 2014-12-04 NOTE — Telephone Encounter (Signed)
Offer Zpak and suggest Mucinex-DM 

## 2014-12-04 NOTE — Telephone Encounter (Signed)
Spoke with pt. Advised him that if he is worried about his BP, only take 1 tablet at time instead of 2. He verbalized understanding.

## 2014-12-04 NOTE — Telephone Encounter (Signed)
Rx has been sent in per CY. Pt's wife, Exie Parody is aware of CY's recommendations. Nothing further was needed.

## 2015-01-08 ENCOUNTER — Telehealth: Payer: Self-pay

## 2015-01-08 NOTE — Telephone Encounter (Signed)
Agree. He can have hemoccults with PCP if he would like. If positive then TCS but otherwise stick with original recommendations by Dr. Gala Romney.

## 2015-01-08 NOTE — Telephone Encounter (Signed)
Pts wife is aware 

## 2015-01-08 NOTE — Telephone Encounter (Signed)
Pt was referred by Dr. Scotty Court for next colonoscopy. His last one was by Dr. Gala Romney on 12/29/2009 and he had tubular adenoma, one fragment. He recommended the next one in 7 years and pt is on recall for 12/2016.  Manuela Schwartz had scheduled an OV with Neil Crouch, PA for 01/26/2015 at 11:30 AM.   I called pt and he is not having any problems. He has no family hx of colon cancer. He is aware that he is on recall for 12/2016 and will be contacted at that time. Appt has been cancelled for 01/26/2015 and he will call if he has any problems before then.

## 2015-01-26 ENCOUNTER — Ambulatory Visit: Payer: Self-pay | Admitting: Gastroenterology

## 2015-02-26 ENCOUNTER — Ambulatory Visit (INDEPENDENT_AMBULATORY_CARE_PROVIDER_SITE_OTHER): Payer: Medicare Other | Admitting: Pulmonary Disease

## 2015-02-26 ENCOUNTER — Encounter: Payer: Self-pay | Admitting: Pulmonary Disease

## 2015-02-26 VITALS — BP 148/80 | HR 64 | Ht 69.0 in | Wt 231.6 lb

## 2015-02-26 DIAGNOSIS — J9611 Chronic respiratory failure with hypoxia: Secondary | ICD-10-CM | POA: Diagnosis not present

## 2015-02-26 DIAGNOSIS — J449 Chronic obstructive pulmonary disease, unspecified: Secondary | ICD-10-CM | POA: Diagnosis not present

## 2015-02-26 NOTE — Progress Notes (Signed)
Chief Complaint  Patient presents with  . Follow-up    Denies any breathing issues currently.     History of Present Illness: Hector Daniels. is a 69 y.o. male former smoker with dyspnea from COPD, systolic CHF, and CAD  His breathing is doing much better.  He is not having cough, wheeze, or sputum.  He denies chest pain,or leg swelling.  He is keeping up with his activities.  He is excited about a new grandson on the way later this month.    He uses symbicort bid.  He does not need to use proair much.  He is using oxygen at night, but not sure if this is helping as much as before.  TESTS: V/Q 09/13/12 >> airtrapping  Echo 10/08/12 >> mild LVH, EF 40 to 45%  Spirometry 11/25/13 >> FEV1 0.74, FEV1% 35  Ambulatory oximetry RA 01/09/14 >> SpO2 80% after 1 lap A1AT 01/09/14 >> 161, PI-MM ONO with RA 01/17/14 >> test time 6 hrs 43 min. Baseline SpO2 92%, low SpO2 82%. Spent 14 min with SpO2 < 88%.  PMHx >> HTN, CAD, systolic CHF  PSHx, Medications, Allergies, Fhx, Shx reviewed.  Physical Exam: Blood pressure 148/80, pulse 64, height 5\' 9"  (1.753 m), weight 231 lb 9.6 oz (105.053 kg), SpO2 98 %. Body mass index is 34.19 kg/(m^2).  General - No distress ENT - No sinus tenderness, no oral exudate, no LAN Cardiac - s1s2 regular, no murmur Chest - No wheeze/rales/dullness Back - No focal tenderness Abd - Soft, non-tender Ext - No edema Neuro - Normal strength Skin - No rashes Psych - normal mood, and behavior   Assessment/Plan:  COPD with chronic bronchitis. He is doing well. Plan: - continue symbicort and prn proair - might be able to step down his inhaler regimen at next visit if he continues to do well  Chronic respiratory failure with hypoxia. Plan: - will arrange for overnight oximetry on room air to determine if he still needs supplemental oxygen at night   Chesley Mires, MD [place] Pulmonary/Critical Care/Sleep Pager:  [phone]

## 2015-02-26 NOTE — Patient Instructions (Signed)
Will arrange for overnight oxygen test on room air ° °Follow up in 6 months °

## 2015-03-11 ENCOUNTER — Telehealth: Payer: Self-pay | Admitting: Pulmonary Disease

## 2015-03-11 NOTE — Telephone Encounter (Signed)
Spoke with pt. Reviewed results and recs. Pt voiced understanding and had no further questions.

## 2015-03-11 NOTE — Telephone Encounter (Signed)
ONO with RA 03/03/15 >> test time 6 hrs 48 min.  Baseline SpO2 92%, low SpO2 78%.  Spent 52 min with SpO2 < 88%.  Will have my nurse inform pt that his oxygen level is still low at night.  He needs to continue using supplemental oxygen while asleep.

## 2015-04-02 ENCOUNTER — Encounter: Payer: Self-pay | Admitting: Pulmonary Disease

## 2015-04-10 IMAGING — CR DG CHEST 2V
2 series · 2 of 2 positions shown · non-contrast
Comparison: None.

CLINICAL DATA: COPD, dyspnea on exertion

EXAM:
CHEST  2 VIEW

[view not recorded (1 of 2)]
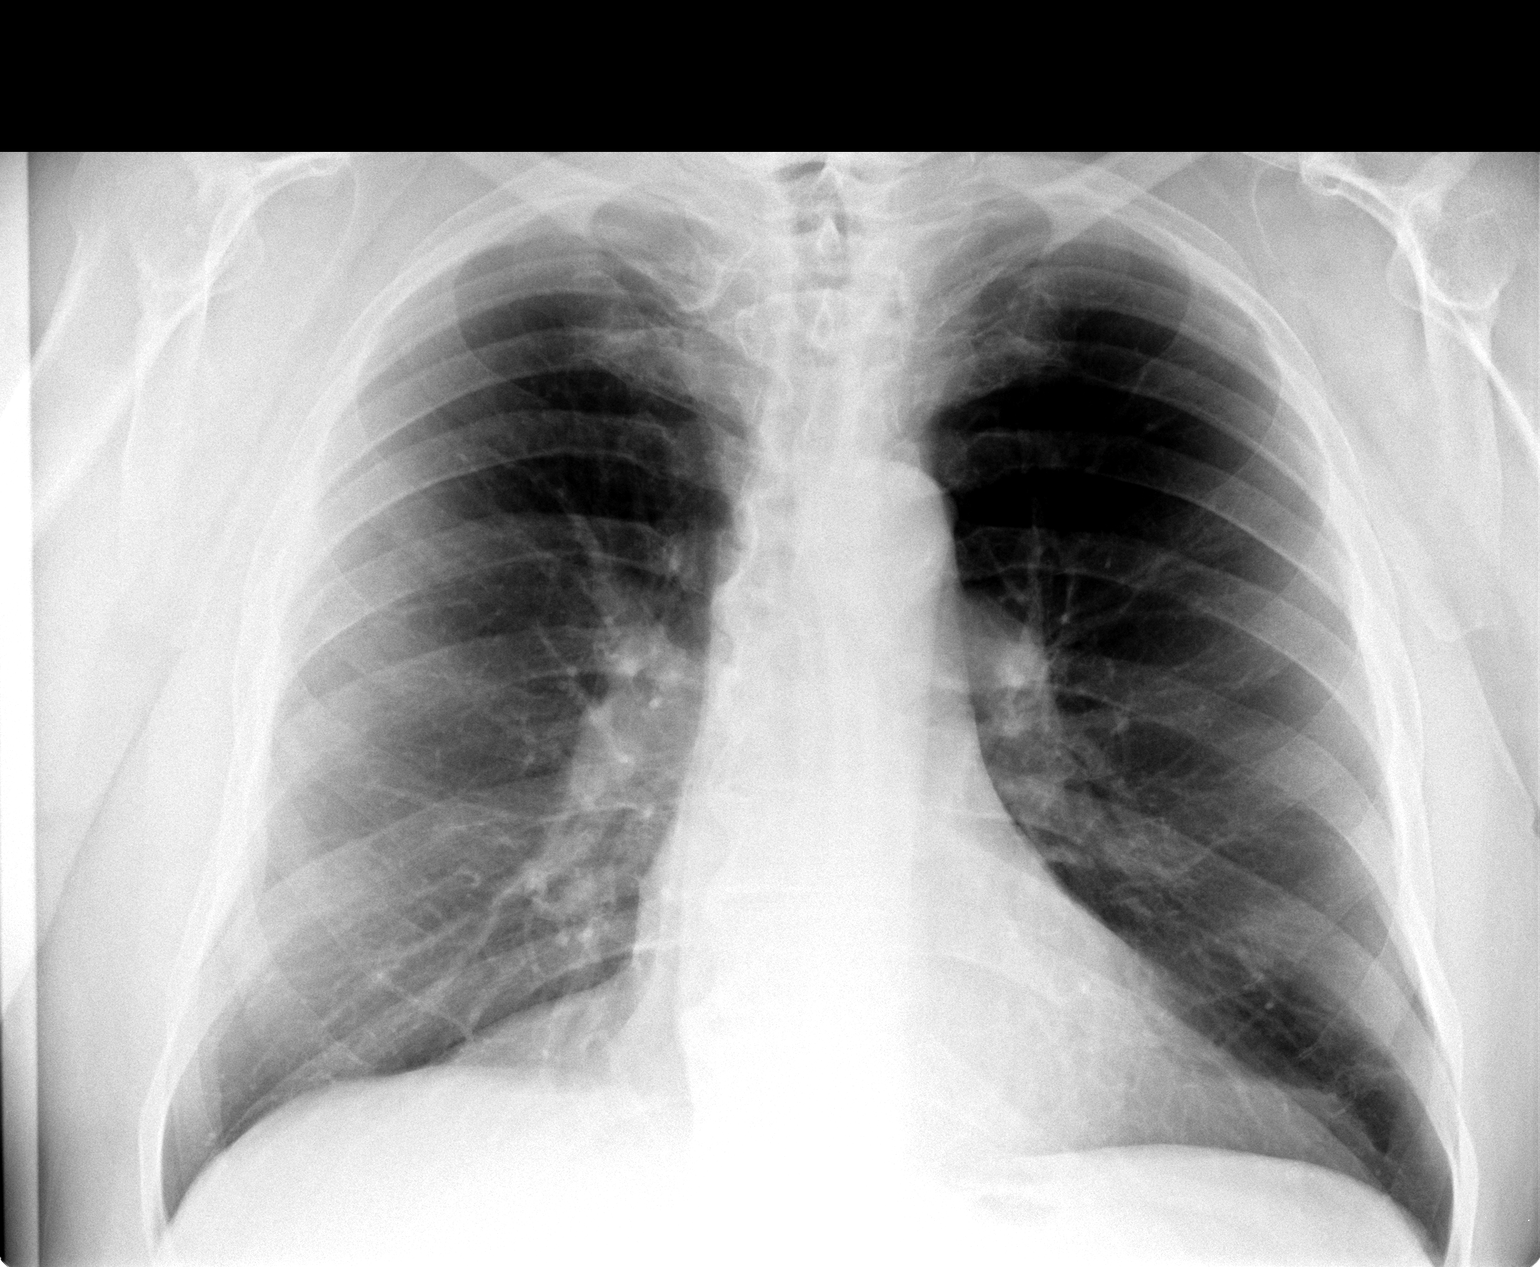

[view not recorded (2 of 2)]
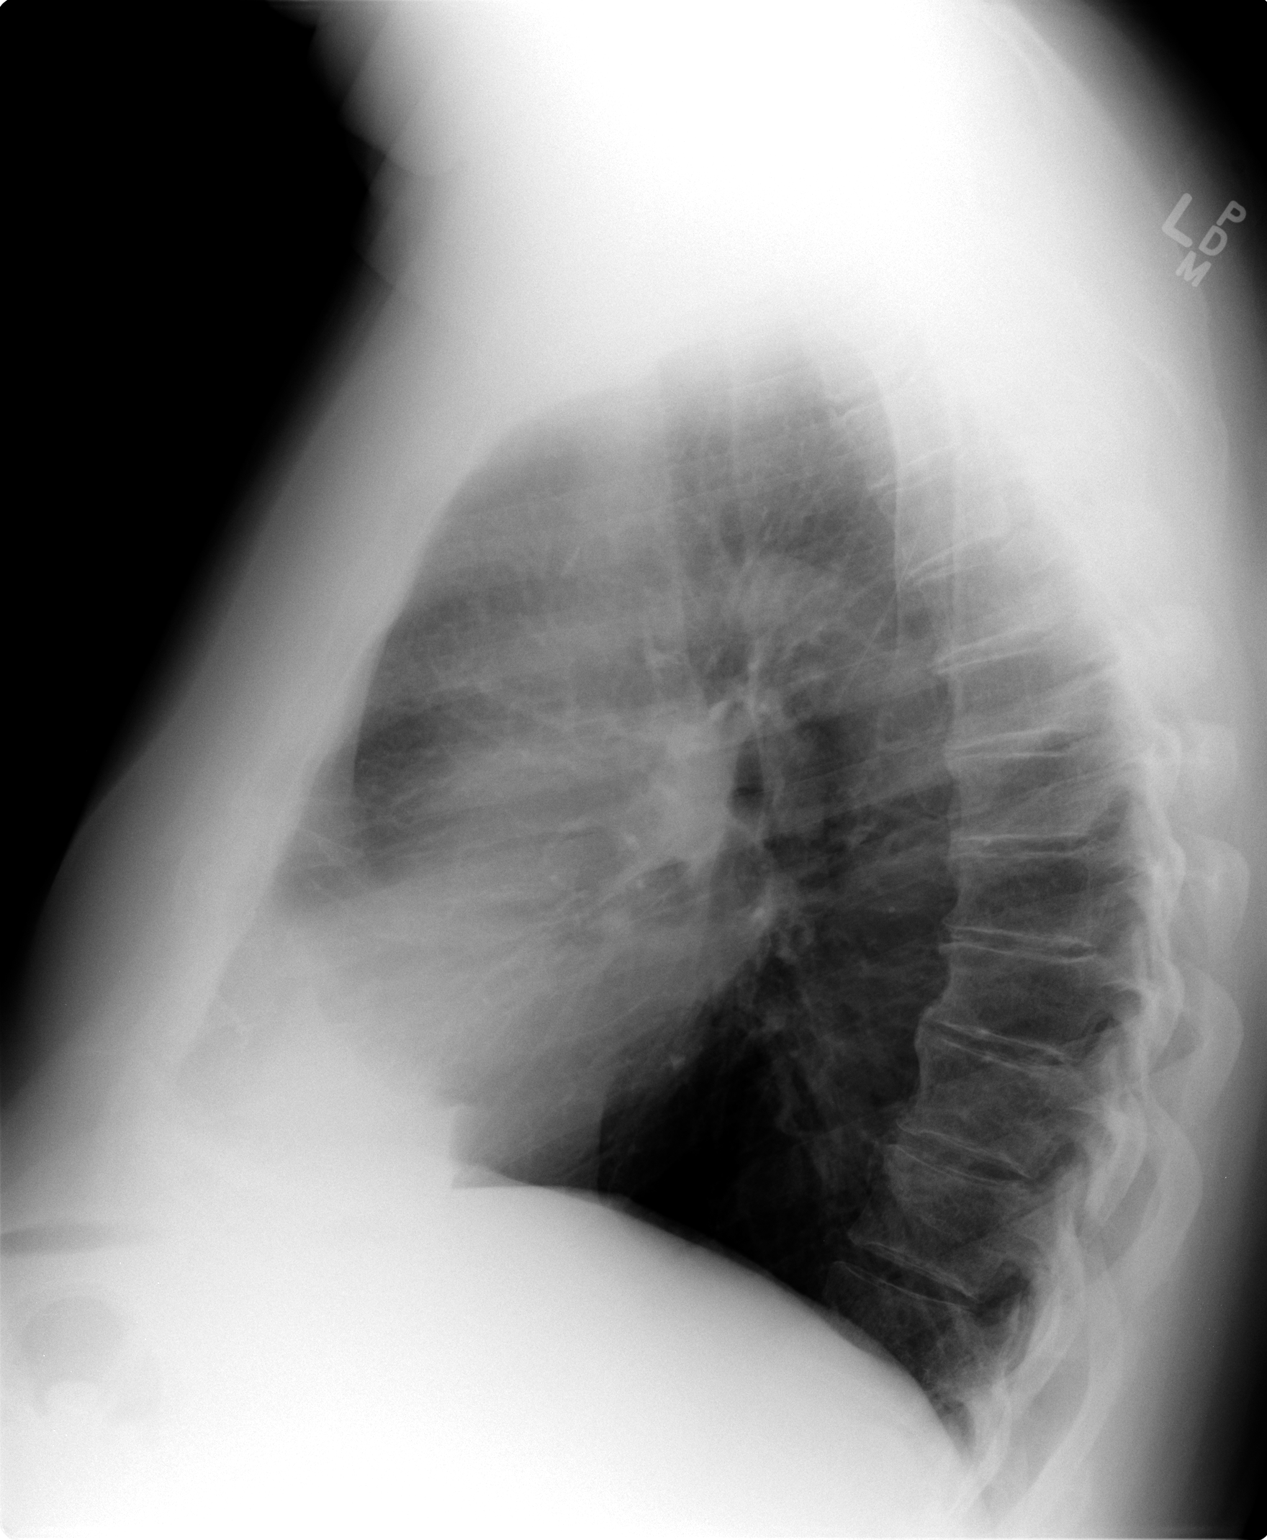

[2 of 2 positions shown; findings below may reference images not displayed]

FINDINGS: The heart size and mediastinal contours are within normal limits.
Both lungs are clear. The visualized skeletal structures are
unremarkable.
IMPRESSION: No active cardiopulmonary disease.

## 2015-07-07 ENCOUNTER — Encounter: Payer: Self-pay | Admitting: Pulmonary Disease

## 2015-07-07 ENCOUNTER — Telehealth: Payer: Self-pay | Admitting: Pulmonary Disease

## 2015-07-07 NOTE — Telephone Encounter (Signed)
Pt returned call [phone]

## 2015-07-07 NOTE — Telephone Encounter (Signed)
lmtcb X1 for pt  

## 2015-07-07 NOTE — Telephone Encounter (Signed)
Letter printed and placed in VS to do folder for signature  Called and informed pt about letter being signed tomorrow by VS Pt states that he will come by office tomorrow afternoon to pick up  Will hold message in triage to ensure follow through with letter

## 2015-07-07 NOTE — Telephone Encounter (Signed)
Spoke with Hector Daniels, requesting a letter writing him out of [employer] the last week of October.  Hector Daniels needs this letter by 10/17 if it can be written. Hector Daniels states this letter is being requested d/t his COPD.    VS are you ok with this letter being written?  Thanks!

## 2015-07-07 NOTE — Telephone Encounter (Signed)
Spoke with pt, requesting a letter writing him out of [employer] the last week of October.

## 2015-07-07 NOTE — Telephone Encounter (Signed)
I have written letter.  Please print from [employer], and I will sign on 07/08/15 when I am back at office.

## 2015-07-08 NOTE — Telephone Encounter (Signed)
VS states that he gave letter to Lanette [place] - please advise if this has been done.  Thanks.

## 2015-07-09 NOTE — Telephone Encounter (Signed)
This letter has been picked up by pt.  Nothing further needed.

## 2015-10-15 ENCOUNTER — Ambulatory Visit: Payer: Medicare Other | Admitting: Pulmonary Disease

## 2015-10-28 ENCOUNTER — Other Ambulatory Visit: Payer: Self-pay | Admitting: Pulmonary Disease

## 2016-01-26 ENCOUNTER — Ambulatory Visit (INDEPENDENT_AMBULATORY_CARE_PROVIDER_SITE_OTHER): Payer: Medicare Other | Admitting: Pulmonary Disease

## 2016-01-26 ENCOUNTER — Encounter: Payer: Self-pay | Admitting: Pulmonary Disease

## 2016-01-26 VITALS — BP 128/78 | HR 65 | Ht 69.0 in | Wt 241.0 lb

## 2016-01-26 DIAGNOSIS — J449 Chronic obstructive pulmonary disease, unspecified: Secondary | ICD-10-CM

## 2016-01-26 DIAGNOSIS — J9611 Chronic respiratory failure with hypoxia: Secondary | ICD-10-CM

## 2016-01-26 NOTE — Progress Notes (Signed)
Current Outpatient Prescriptions on File Prior to Visit  Medication Sig  . albuterol (PROAIR HFA) 108 (90 BASE) MCG/ACT inhaler Inhale 2 puffs into the lungs every 6 (six) hours as needed for wheezing or shortness of breath.  Marland Kitchen amLODipine (NORVASC) 5 MG tablet Take 5 mg by mouth daily.  Marland Kitchen aspirin 325 MG tablet Take 325 mg by mouth daily.  . Cholecalciferol (VITAMIN D3) 2000 UNITS capsule Take 2,000 Units by mouth daily.  . clopidogrel (PLAVIX) 75 MG tablet Take 75 mg by mouth daily.  . fluticasone (FLONASE) 50 MCG/ACT nasal spray Place 1 spray into both nostrils daily.  . hydrALAZINE (APRESOLINE) 50 MG tablet Take 50 mg by mouth 3 (three) times daily.   Marland Kitchen loratadine (CLARITIN) 10 MG tablet Take 10 mg by mouth daily.  . metoprolol (LOPRESSOR) 50 MG tablet Take 75 mg by mouth 2 (two) times daily.  . Multiple Vitamin (MULTIVITAMIN) capsule Take 1 capsule by mouth daily.  . ranitidine (ZANTAC) 75 MG tablet Take 150 mg by mouth 2 (two) times daily.  . sildenafil (VIAGRA) 100 MG tablet Take 100 mg by mouth daily as needed.  . simvastatin (ZOCOR) 40 MG tablet Take 40 mg by mouth every evening.  . SYMBICORT 160-4.5 MCG/ACT inhaler INHALE TWO PUFFS INTO THE LUNGS TWICE DAILY   No current facility-administered medications on file prior to visit.    Chief Complaint  Patient presents with  . Follow-up    Pt denies any current breathing issues outside of SOB with exertion. Still sleeps with O2 2Liters    Tests V/Q 09/13/12 >> airtrapping  Echo 10/08/12 >> mild LVH, EF 40 to 45%  Spirometry 11/25/13 >> FEV1 0.74, FEV1% 35  Ambulatory oximetry RA 01/09/14 >> SpO2 80% after 1 lap A1AT 01/09/14 >> 161, PI-MM ONO with RA 03/03/15 >> test time 6 hrs 48 min. Baseline SpO2 92%, low SpO2 78%. Spent 52 min with SpO2 < 88%.  Past medical history HTN, CAD, systolic CHF  Past surgical history, Allergies, Family history, Social history reviewed  Vital signs BP 128/78 mmHg  Pulse 65  Ht 5\' 9"  (1.753 m)   Wt 241 lb (109.317 kg)  BMI 35.57 kg/m2  SpO2 98%   History of Present Illness: Hector Daniels. is a 69 y.o. male former smoker with COPD and chronic hypoxic respiratory failure.  He has been doing well with his breathing.  He doesn't have trouble if he goes at a steady pace.  He is not having cough, wheeze, or sputum.  He denies chest pain or leg swelling.  He has not needed to use albuterol recently.  He feels symbicort helps.  He is using his oxygen at night.  Physical Exam:  General - No distress ENT - No sinus tenderness, no oral exudate, no LAN Cardiac - s1s2 regular, no murmur Chest - No wheeze/rales/dullness Back - No focal tenderness Abd - Soft, non-tender Ext - No edema Neuro - Normal strength Skin - No rashes Psych - normal mood, and behavior   Assessment/Plan:  COPD with chronic bronchitis. He is doing well. Plan: - continue symbicort and prn proair  Chronic respiratory failure with hypoxia. Plan: - continue 2 liters oxygen while asleep   Patient Instructions  Follow up in 1 year    Chesley Mires, MD [place] Pulmonary/Critical Care/Sleep Pager:  [phone] 01/26/2016, 11:07 AM

## 2016-01-26 NOTE — Patient Instructions (Signed)
Follow up in 1 year.

## 2016-03-15 ENCOUNTER — Other Ambulatory Visit: Payer: Self-pay | Admitting: Pulmonary Disease

## 2016-07-25 ENCOUNTER — Other Ambulatory Visit: Payer: Self-pay | Admitting: Pulmonary Disease

## 2016-09-28 ENCOUNTER — Other Ambulatory Visit: Payer: Self-pay | Admitting: Pulmonary Disease

## 2016-11-29 ENCOUNTER — Encounter: Payer: Self-pay | Admitting: Internal Medicine

## 2017-01-25 ENCOUNTER — Ambulatory Visit: Payer: Medicare Other | Admitting: Pulmonary Disease

## 2017-01-26 ENCOUNTER — Ambulatory Visit (INDEPENDENT_AMBULATORY_CARE_PROVIDER_SITE_OTHER): Payer: Medicare Other | Admitting: Pulmonary Disease

## 2017-01-26 ENCOUNTER — Encounter: Payer: Self-pay | Admitting: Pulmonary Disease

## 2017-01-26 VITALS — BP 134/72 | HR 67 | Ht 69.0 in | Wt 243.6 lb

## 2017-01-26 DIAGNOSIS — J410 Simple chronic bronchitis: Secondary | ICD-10-CM

## 2017-01-26 DIAGNOSIS — J9611 Chronic respiratory failure with hypoxia: Secondary | ICD-10-CM | POA: Diagnosis not present

## 2017-01-26 MED ORDER — UMECLIDINIUM BROMIDE 62.5 MCG/INH IN AEPB: 1.0000 | INHALATION_SPRAY | Freq: Every day | RESPIRATORY_TRACT | 6 refills | Status: DC

## 2017-01-26 MED ORDER — UMECLIDINIUM BROMIDE 62.5 MCG/INH IN AEPB
1.0000 | INHALATION_SPRAY | Freq: Every day | RESPIRATORY_TRACT | Status: DC
Start: 2017-01-26 — End: 2019-05-09

## 2017-01-26 NOTE — Patient Instructions (Signed)
Incruse one puff daily  Stop symbicort while using incruse  Call if your breathing gets worse after changing to incruse  Follow up in 1 year

## 2017-01-26 NOTE — Progress Notes (Signed)
Current Outpatient Prescriptions on File Prior to Visit  Medication Sig  . amLODipine (NORVASC) 5 MG tablet Take 5 mg by mouth daily.  Marland Kitchen aspirin 325 MG tablet Take 325 mg by mouth daily.  . Cholecalciferol (VITAMIN D3) 2000 UNITS capsule Take 2,000 Units by mouth daily.  . clopidogrel (PLAVIX) 75 MG tablet Take 75 mg by mouth daily.  . fluticasone (FLONASE) 50 MCG/ACT nasal spray Place 1 spray into both nostrils daily.  . hydrALAZINE (APRESOLINE) 50 MG tablet Take 50 mg by mouth 3 (three) times daily.   Marland Kitchen loratadine (CLARITIN) 10 MG tablet Take 10 mg by mouth daily.  . metoprolol (LOPRESSOR) 50 MG tablet Take 75 mg by mouth 2 (two) times daily.  . Multiple Vitamin (MULTIVITAMIN) capsule Take 1 capsule by mouth daily.  . OXYGEN 2Liters at night  . PROAIR HFA 108 (90 Base) MCG/ACT inhaler INHALE TWO PUFFS BY MOUTH EVERY 6 HOURS AS NEEDED FOR FOR WHEEZING AND SHORTNESS OF BREATH  . ranitidine (ZANTAC) 75 MG tablet Take 150 mg by mouth 2 (two) times daily.  . sildenafil (VIAGRA) 100 MG tablet Take 100 mg by mouth daily as needed.  . simvastatin (ZOCOR) 40 MG tablet Take 40 mg by mouth every evening.   No current facility-administered medications on file prior to visit.     Chief Complaint  Patient presents with  . Follow-up    Pt denies any new issues or increased symptoms. Pt does note that he notices SOB with bending and climbing stairs.     Pulmonary tests V/Q 09/13/12 >> airtrapping  Spirometry 11/25/13 >> FEV1 0.74, FEV1% 35  Ambulatory oximetry RA 01/09/14 >> SpO2 80% after 1 lap A1AT 01/09/14 >> 161, PI-MM ONO with RA 03/03/15 >> test time 6 hrs 48 min. Baseline SpO2 92%, low SpO2 78%. Spent 52 min with SpO2 < 88%.  Cardiac tests Echo 10/08/12 >> mild LVH, EF 40 to 45%   Past medical history HTN, CAD, systolic CHF  Past surgical history, Allergies, Family history, Social history reviewed  Vital signs BP 134/72 (BP Location: Left Arm, Cuff Size: Normal)   Pulse 67   Ht 5'  9" (1.753 m)   Wt 243 lb 9.6 oz (110.5 kg)   SpO2 95%   BMI 35.97 kg/m    History of Present Illness: Hector Daniels. is a 71 y.o. male former smoker with COPD and chronic hypoxic respiratory failure.  He gets cough and clears his throat in the morning.  He is not having wheeze.  Notices trouble breathing when bending over to ties his shoes.  Denies chest pain, fever, hemoptysis, swelling.  Notices his sleep and snoring are worse if his oxygen comes off at night.  Physical Exam:  General - pleasant Eyes - pupils reactive, wears glasses ENT - no sinus tenderness, no oral exudate, no LAN Cardiac - regular, no murmur Chest - no wheeze, rales Abd - soft, non tender Ext - no edema Skin - no rashes Neuro - normal strength Psych - normal mood   Assessment/Plan:  COPD with chronic bronchitis. - doing well - will try changing him from symbicort to incruse - continue prn albuterol  Chronic respiratory failure with hypoxia. - continue 2 liters oxygen at night   Patient Instructions  Incruse one puff daily  Stop symbicort while using incruse  Call if your breathing gets worse after changing to incruse  Follow up in 1 year   Chesley Mires, MD [place] Pager:  [phone]  01/26/2017, 11:17 AM

## 2017-01-26 NOTE — Progress Notes (Signed)
Patient seen in the office today and instructed on use of Incruse.  Patient expressed understanding and demonstrated technique. Virl Cagey, CMA

## 2017-01-26 NOTE — Addendum Note (Signed)
Addended by: Virl Cagey on: 01/26/2017 11:25 AM   Modules accepted: Orders

## 2017-02-01 ENCOUNTER — Telehealth: Payer: Self-pay | Admitting: Pulmonary Disease

## 2017-02-01 NOTE — Telephone Encounter (Signed)
A PA was initiated via [email or web address] for Incruse Ellipta using key [id number]. The information was sent to plan and will take up 3 days for a decision. Will route to AG to follow.   [place] Key: [id number]

## 2017-02-02 NOTE — Telephone Encounter (Signed)
Hector Daniels from [facility], [place] is [phone].  Incruse Ellipta has been approved effective 02/01/2017 for one year.

## 2017-02-02 NOTE — Telephone Encounter (Signed)
I have spoken with Lattie Haw with [facility], who states incuse has been approved 02/01/17 to 02/01/18.  Both pt and pharmacy have been made aware. Nothing further needed.

## 2017-03-03 ENCOUNTER — Ambulatory Visit (INDEPENDENT_AMBULATORY_CARE_PROVIDER_SITE_OTHER): Payer: Medicare Other | Admitting: Internal Medicine

## 2017-03-03 ENCOUNTER — Other Ambulatory Visit: Payer: Self-pay

## 2017-03-03 ENCOUNTER — Encounter: Payer: Self-pay | Admitting: Internal Medicine

## 2017-03-03 VITALS — BP 147/88 | HR 64 | Temp 97.5°F | Ht 69.0 in | Wt 241.2 lb

## 2017-03-03 DIAGNOSIS — Z860100 Personal history of colon polyps, unspecified: Secondary | ICD-10-CM

## 2017-03-03 MED ORDER — PEG 3350-KCL-NA BICARB-NACL 420 G PO SOLR: 4000.0000 mL | ORAL | 0 refills | Status: DC

## 2017-03-03 NOTE — Progress Notes (Signed)
Primary Care Physician:  Deloria Lair., MD Primary Gastroenterologist:  Dr. Gala Romney  Pre-Procedure History & Physical: HPI:  Hector Hornig. is a 71 y.o. male here for history of colonic adenoma. Here to discuss a surveillance examination. Single small adenoma removed 2011. He's done well since that time. No bowel symptoms. We recommended perhaps with more surveillance colonoscopy at this time. I discussed risks, benefits,  Indications, alternatives and imponderables with him at length today.  He does take aspirin and Plavix. He has COPD well controlled. Occasionally uses oxygen at bedtime.  Past Medical History:  Diagnosis Date  . Acute renal failure ([place])    Episode 12/13, prerenal, ACE inhibitor and diuretic discontinued  . Coronary atherosclerosis of native coronary artery    Moderate multivessel, s/p DES x 2 RCA 2006  . Essential hypertension, benign   . History of hematuria   . Mixed hyperlipidemia   . Tubular adenoma     Past Surgical History:  Procedure Laterality Date  . LAPAROSCOPIC APPENDECTOMY    . TONSILLECTOMY AND ADENOIDECTOMY    . VASECTOMY      Prior to Admission medications   Medication Sig Start Date End Date Taking? Authorizing Provider  amLODipine (NORVASC) 5 MG tablet Take 5 mg by mouth daily.   Yes [provider]  aspirin 325 MG tablet Take 325 mg by mouth daily.   Yes [provider]  atorvastatin (LIPITOR) 40 MG tablet Take 40 mg by mouth daily. 01/30/17  Yes [provider]  Cholecalciferol (VITAMIN D3) 2000 UNITS capsule Take 2,000 Units by mouth daily.   Yes [provider]  clopidogrel (PLAVIX) 75 MG tablet Take 75 mg by mouth daily.   Yes [provider]  fluticasone (FLONASE) 50 MCG/ACT nasal spray Place 1 spray into both nostrils daily. 12/30/13  Yes [provider]  hydrALAZINE (APRESOLINE) 50 MG tablet Take 50 mg by mouth 3 (three) times daily.    Yes [provider]  loratadine  (CLARITIN) 10 MG tablet Take 10 mg by mouth daily.   Yes [provider]  metoprolol (LOPRESSOR) 50 MG tablet Take 75 mg by mouth 2 (two) times daily.   Yes [provider]  Multiple Vitamin (MULTIVITAMIN) capsule Take 1 capsule by mouth daily.   Yes [provider]  OXYGEN 2Liters at night   Yes [provider]  PROAIR HFA 108 (90 Base) MCG/ACT inhaler INHALE TWO PUFFS BY MOUTH EVERY 6 HOURS AS NEEDED FOR FOR WHEEZING AND SHORTNESS OF BREATH 07/25/16  Yes Chesley Mires, MD  ranitidine (ZANTAC) 75 MG tablet Take 150 mg by mouth 2 (two) times daily.   Yes [provider]  sildenafil (VIAGRA) 100 MG tablet Take 100 mg by mouth daily as needed.   Yes [provider]  umeclidinium bromide (INCRUSE ELLIPTA) 62.5 MCG/INH AEPB Inhale 1 puff into the lungs daily. 01/26/17  Yes Chesley Mires, MD  simvastatin (ZOCOR) 40 MG tablet Take 40 mg by mouth every evening.    [provider]    Allergies as of 03/03/2017 - Review Complete 03/03/2017  Allergen Reaction Noted  . Crestor [rosuvastatin]  10/17/2012  . Tessalon [benzonatate]  10/17/2012    Family History  Problem Relation Age of Onset  . Melanoma Father        Cause of death, age 48  . Heart disease Mother        Died age 53  . Hyperlipidemia Brother     Social History  Social History  . Marital status: Married    Spouse name: N/A  . Number of children: N/A  . Years of education: N/A   Occupational History  . Not on file.   Social History Main Topics  . Smoking status: Former Smoker    Packs/day: 1.00    Years: 13.00    Types: Cigarettes    Start date: 09/26/1965    Quit date: 09/26/1978  . Smokeless tobacco: Never Used  . Alcohol use Yes     Comment: Socially  . Drug use: No  . Sexual activity: Not on file   Other Topics Concern  . Not on file   Social History Narrative  . No narrative on file    Review of Systems: See HPI, otherwise negative ROS  Physical  Exam: BP (!) 147/88   Pulse 64   Temp 97.5 F (36.4 C) (Oral)   Ht 5\' 9"  (1.753 m)   Wt 241 lb 3.2 oz (109.4 kg)   BMI 35.62 kg/m    General:   Alert,  , well-nourished, pleasant and cooperative in NAD Lungs:  Clear throughout to auscultation.   No wheezes, crackles, or rhonchi. No acute distress. Heart:  Regular rate and rhythm; no murmurs, clicks, rubs,  or gallops. Abdomen: Non-distended, normal bowel sounds.  Soft and nontender without appreciable mass or hepatosplenomegaly.  Pulses:  Normal pulses noted. Extremities:  Without clubbing or edema.  Impression:  Pleasant 71 year old gentleman with a history of colonic adenoma.  Due for a surveillance examination at this time. Overall, I feel the benefits will outweigh the risk of doing at least one more colonoscopy depending on findings. This approach has been discussed with patient at length. Patient is desirous of having an updated colonoscopy at this time   Recommendations:   Schedule a surveillance colonoscopy - Hx of polyps - conscious sedation  Split prep  Patient to take BP medications with a small amount of water morning of procedure  Further recommendations to follow        Notice: This dictation was prepared with Dragon dictation along with smaller phrase technology. Any transcriptional errors that result from this process are unintentional and may not be corrected upon review.

## 2017-03-03 NOTE — Patient Instructions (Addendum)
Schedule a surveillance colonoscopy - Hx of polyps - conscious sedation  Split prep  Take your BP medications with a small amount of water morning of procedure  Further recommendations to follow

## 2017-03-27 ENCOUNTER — Telehealth: Payer: Self-pay

## 2017-03-27 NOTE — Telephone Encounter (Signed)
Pt's wife had called office and LMOVM that pt was in hospital and was going to be having his gallbladder removed. She wanted to postpone his Colonoscopy that was scheduled for 04/04/17. Called pt's wife and informed her that the pt would need to come in for OV to update his information and reschedule Colonoscopy. She will call the office after he gets out of the hospital to schedule OV. Called and informed Endo Scheduler to cancel procedure.  Routing to RMR as FYI.

## 2017-03-27 NOTE — Telephone Encounter (Signed)
noted 

## 2017-04-04 ENCOUNTER — Encounter (HOSPITAL_COMMUNITY): Admission: RE | Payer: Self-pay | Source: Ambulatory Visit

## 2017-04-04 ENCOUNTER — Ambulatory Visit (HOSPITAL_COMMUNITY): Admission: RE | Admit: 2017-04-04 | Payer: Medicare Other | Source: Ambulatory Visit | Admitting: Internal Medicine

## 2017-04-04 SURGERY — COLONOSCOPY
Anesthesia: Moderate Sedation

## 2017-06-26 ENCOUNTER — Encounter (INDEPENDENT_AMBULATORY_CARE_PROVIDER_SITE_OTHER): Payer: Medicare Other | Admitting: Ophthalmology

## 2017-06-26 DIAGNOSIS — H35033 Hypertensive retinopathy, bilateral: Secondary | ICD-10-CM | POA: Diagnosis not present

## 2017-06-26 DIAGNOSIS — I10 Essential (primary) hypertension: Secondary | ICD-10-CM

## 2017-06-26 DIAGNOSIS — H353121 Nonexudative age-related macular degeneration, left eye, early dry stage: Secondary | ICD-10-CM | POA: Diagnosis not present

## 2017-06-26 DIAGNOSIS — H353211 Exudative age-related macular degeneration, right eye, with active choroidal neovascularization: Secondary | ICD-10-CM | POA: Diagnosis not present

## 2017-06-26 DIAGNOSIS — H43813 Vitreous degeneration, bilateral: Secondary | ICD-10-CM

## 2017-07-26 ENCOUNTER — Encounter (INDEPENDENT_AMBULATORY_CARE_PROVIDER_SITE_OTHER): Payer: Medicare Other | Admitting: Ophthalmology

## 2017-07-26 DIAGNOSIS — H35033 Hypertensive retinopathy, bilateral: Secondary | ICD-10-CM

## 2017-07-26 DIAGNOSIS — H353121 Nonexudative age-related macular degeneration, left eye, early dry stage: Secondary | ICD-10-CM | POA: Diagnosis not present

## 2017-07-26 DIAGNOSIS — H353211 Exudative age-related macular degeneration, right eye, with active choroidal neovascularization: Secondary | ICD-10-CM | POA: Diagnosis not present

## 2017-07-26 DIAGNOSIS — I10 Essential (primary) hypertension: Secondary | ICD-10-CM | POA: Diagnosis not present

## 2017-07-26 DIAGNOSIS — H43813 Vitreous degeneration, bilateral: Secondary | ICD-10-CM | POA: Diagnosis not present

## 2017-08-09 ENCOUNTER — Other Ambulatory Visit: Payer: Self-pay | Admitting: Pulmonary Disease

## 2017-08-23 ENCOUNTER — Encounter (INDEPENDENT_AMBULATORY_CARE_PROVIDER_SITE_OTHER): Payer: Medicare Other | Admitting: Ophthalmology

## 2017-08-23 DIAGNOSIS — I10 Essential (primary) hypertension: Secondary | ICD-10-CM | POA: Diagnosis not present

## 2017-08-23 DIAGNOSIS — H353121 Nonexudative age-related macular degeneration, left eye, early dry stage: Secondary | ICD-10-CM

## 2017-08-23 DIAGNOSIS — H35033 Hypertensive retinopathy, bilateral: Secondary | ICD-10-CM | POA: Diagnosis not present

## 2017-08-23 DIAGNOSIS — H353211 Exudative age-related macular degeneration, right eye, with active choroidal neovascularization: Secondary | ICD-10-CM | POA: Diagnosis not present

## 2017-08-23 DIAGNOSIS — H43813 Vitreous degeneration, bilateral: Secondary | ICD-10-CM

## 2017-09-11 ENCOUNTER — Other Ambulatory Visit: Payer: Self-pay | Admitting: Pulmonary Disease

## 2017-09-13 ENCOUNTER — Encounter (INDEPENDENT_AMBULATORY_CARE_PROVIDER_SITE_OTHER): Payer: Medicare Other | Admitting: Ophthalmology

## 2017-09-13 DIAGNOSIS — H353121 Nonexudative age-related macular degeneration, left eye, early dry stage: Secondary | ICD-10-CM

## 2017-09-13 DIAGNOSIS — H43813 Vitreous degeneration, bilateral: Secondary | ICD-10-CM | POA: Diagnosis not present

## 2017-09-13 DIAGNOSIS — H35033 Hypertensive retinopathy, bilateral: Secondary | ICD-10-CM

## 2017-09-13 DIAGNOSIS — H353211 Exudative age-related macular degeneration, right eye, with active choroidal neovascularization: Secondary | ICD-10-CM | POA: Diagnosis not present

## 2017-09-13 DIAGNOSIS — I10 Essential (primary) hypertension: Secondary | ICD-10-CM

## 2017-10-17 ENCOUNTER — Encounter (INDEPENDENT_AMBULATORY_CARE_PROVIDER_SITE_OTHER): Payer: Medicare Other | Admitting: Ophthalmology

## 2017-10-17 DIAGNOSIS — I10 Essential (primary) hypertension: Secondary | ICD-10-CM | POA: Diagnosis not present

## 2017-10-17 DIAGNOSIS — H35033 Hypertensive retinopathy, bilateral: Secondary | ICD-10-CM | POA: Diagnosis not present

## 2017-10-17 DIAGNOSIS — H43813 Vitreous degeneration, bilateral: Secondary | ICD-10-CM

## 2017-10-17 DIAGNOSIS — H353121 Nonexudative age-related macular degeneration, left eye, early dry stage: Secondary | ICD-10-CM | POA: Diagnosis not present

## 2017-10-17 DIAGNOSIS — H353211 Exudative age-related macular degeneration, right eye, with active choroidal neovascularization: Secondary | ICD-10-CM

## 2017-10-18 ENCOUNTER — Encounter (INDEPENDENT_AMBULATORY_CARE_PROVIDER_SITE_OTHER): Payer: Medicare Other | Admitting: Ophthalmology

## 2017-11-22 ENCOUNTER — Encounter (INDEPENDENT_AMBULATORY_CARE_PROVIDER_SITE_OTHER): Payer: Medicare Other | Admitting: Ophthalmology

## 2017-11-22 DIAGNOSIS — H353121 Nonexudative age-related macular degeneration, left eye, early dry stage: Secondary | ICD-10-CM | POA: Diagnosis not present

## 2017-11-22 DIAGNOSIS — H35033 Hypertensive retinopathy, bilateral: Secondary | ICD-10-CM | POA: Diagnosis not present

## 2017-11-22 DIAGNOSIS — H43813 Vitreous degeneration, bilateral: Secondary | ICD-10-CM | POA: Diagnosis not present

## 2017-11-22 DIAGNOSIS — H353211 Exudative age-related macular degeneration, right eye, with active choroidal neovascularization: Secondary | ICD-10-CM | POA: Diagnosis not present

## 2017-11-22 DIAGNOSIS — I10 Essential (primary) hypertension: Secondary | ICD-10-CM

## 2017-12-27 ENCOUNTER — Encounter (INDEPENDENT_AMBULATORY_CARE_PROVIDER_SITE_OTHER): Payer: Medicare Other | Admitting: Ophthalmology

## 2017-12-27 DIAGNOSIS — I10 Essential (primary) hypertension: Secondary | ICD-10-CM

## 2017-12-27 DIAGNOSIS — H43813 Vitreous degeneration, bilateral: Secondary | ICD-10-CM

## 2017-12-27 DIAGNOSIS — H353122 Nonexudative age-related macular degeneration, left eye, intermediate dry stage: Secondary | ICD-10-CM

## 2017-12-27 DIAGNOSIS — H353211 Exudative age-related macular degeneration, right eye, with active choroidal neovascularization: Secondary | ICD-10-CM

## 2017-12-27 DIAGNOSIS — H35033 Hypertensive retinopathy, bilateral: Secondary | ICD-10-CM

## 2018-01-15 ENCOUNTER — Telehealth: Payer: Self-pay | Admitting: Pulmonary Disease

## 2018-01-15 MED ORDER — ALBUTEROL SULFATE HFA 108 (90 BASE) MCG/ACT IN AERS
INHALATION_SPRAY | RESPIRATORY_TRACT | 2 refills | Status: DC
Start: 2018-01-15 — End: 2019-09-10

## 2018-01-15 NOTE — Telephone Encounter (Signed)
Medication rejection for ProAir HFA was sent from [facility] in [place].  Called pharmacy and patient no long uses this drug store.  He uses [facility]. Patient's RX was sent to [facility] who states they would switch it with a generic equivalent Ventolin.  Preferred pharmacy was changed in computer.  Nothing further needed.

## 2018-02-07 ENCOUNTER — Encounter (INDEPENDENT_AMBULATORY_CARE_PROVIDER_SITE_OTHER): Payer: Medicare Other | Admitting: Ophthalmology

## 2018-02-07 DIAGNOSIS — H353211 Exudative age-related macular degeneration, right eye, with active choroidal neovascularization: Secondary | ICD-10-CM

## 2018-02-07 DIAGNOSIS — I10 Essential (primary) hypertension: Secondary | ICD-10-CM

## 2018-02-07 DIAGNOSIS — H35033 Hypertensive retinopathy, bilateral: Secondary | ICD-10-CM | POA: Diagnosis not present

## 2018-02-07 DIAGNOSIS — H43813 Vitreous degeneration, bilateral: Secondary | ICD-10-CM

## 2018-02-07 DIAGNOSIS — H353122 Nonexudative age-related macular degeneration, left eye, intermediate dry stage: Secondary | ICD-10-CM | POA: Diagnosis not present

## 2018-02-28 ENCOUNTER — Other Ambulatory Visit: Payer: Self-pay | Admitting: Pulmonary Disease

## 2018-03-07 ENCOUNTER — Encounter: Payer: Self-pay | Admitting: Pulmonary Disease

## 2018-03-07 ENCOUNTER — Ambulatory Visit: Payer: Medicare Other | Admitting: Pulmonary Disease

## 2018-03-07 VITALS — BP 110/76 | HR 59 | Ht 70.0 in | Wt 227.0 lb

## 2018-03-07 DIAGNOSIS — J449 Chronic obstructive pulmonary disease, unspecified: Secondary | ICD-10-CM | POA: Diagnosis not present

## 2018-03-07 NOTE — Progress Notes (Signed)
[facility]  Chief Complaint  Patient presents with  . Follow-up    Pt has lost weight since last OV, feeling better and breathing better since last OV. Pt would like to know if he needs to con't O2 at bedtime.    Vital signs: BP 110/76 (BP Location: Left Arm, Cuff Size: Normal)   Pulse (!) 59   Ht 5\' 10"  (1.778 m)   Wt 227 lb (103 kg)   SpO2 95%   BMI 32.57 kg/m   History of Present Illness: Hector Vitolo. is a 72 y.o. male former smoker with COPD and chronic respiratory failure.  He has been doing well  Had surgery and lost weight.  This helped his breathing.  Not having cough, wheeze, sputum, leg swelling.  Not needing to use albuterol.    Physical Exam:  General - pleasant Eyes - pupils reactive ENT - no sinus tenderness, no oral exudate, no LAN Cardiac - regular, no murmur Chest - no wheeze, rales Abd - soft, non tender Ext - no edema Skin - no rashes Neuro - normal strength Psych - normal mood   Assessment/Plan:  COPD with chronic bronchitis. - continue incruse with prn albuterol  Chronic respiratory failure with hypoxia. - has been on 2 liters oxygen at night - will arrange for ONO with RA to determine if he can have home oxygen d/c'ed   Patient Instructions  Will arrange for overnight oxygen test  Follow up in 1 year    Chesley Mires, MD [place] 03/07/2018, 12:29 PM  Flow Sheet  Pulmonary tests: V/Q 09/13/12 >> airtrapping  Spirometry 11/25/13 >> FEV1 0.74, FEV1% 35  Ambulatory oximetry RA 01/09/14 >> SpO2 80% after 1 lap A1AT 01/09/14 >> 161, PI-MM ONO with RA 03/03/15 >> test time 6 hrs 48 min. Baseline SpO2 92%, low SpO2 78%. Spent 52 min with SpO2 < 88%.  Cardiac tests Echo 10/08/12 >> mild LVH, EF 40 to 45%   Past Medical History: He  has a past medical history of Acute renal failure ([place]), Coronary atherosclerosis of native coronary artery, Essential hypertension, benign,  History of hematuria, Mixed hyperlipidemia, and Tubular adenoma.  Past Surgical History: He  has a past surgical history that includes Tonsillectomy and adenoidectomy; Vasectomy; and Laparoscopic appendectomy.  Family History: His family history includes Heart disease in his mother; Hyperlipidemia in his brother; Melanoma in his father.  Social History: He  reports that he quit smoking about 39 years ago. His smoking use included cigarettes. He started smoking about 52 years ago. He has a 13.00 pack-year smoking history. He has never used smokeless tobacco. He reports that he drinks alcohol. He reports that he does not use drugs.  Medications: Allergies as of 03/07/2018      Reactions   Crestor [rosuvastatin]    Right hip pain and weakness   Tessalon [benzonatate]    Weird feeling      Medication List        Accurate as of 03/07/18 12:29 PM. Always use your most recent med list.          albuterol 108 (90 Base) MCG/ACT inhaler Commonly known as:  PROAIR HFA INHALE TWO PUFFS BY MOUTH EVERY 6 HOURS AS NEEDED FOR FOR WHEEZING AND SHORTNESS OF BREATH   amLODipine 5 MG tablet Commonly known as:  NORVASC Take 5 mg by mouth daily.   aspirin 325 MG tablet Take 325 mg by mouth daily.   atorvastatin  40 MG tablet Commonly known as:  LIPITOR Take 40 mg by mouth daily.   clopidogrel 75 MG tablet Commonly known as:  PLAVIX Take 75 mg by mouth daily.   fluticasone 50 MCG/ACT nasal spray Commonly known as:  FLONASE Place 1 spray into both nostrils daily.   hydrALAZINE 50 MG tablet Commonly known as:  APRESOLINE Take 50 mg by mouth 3 (three) times daily.   INCRUSE ELLIPTA 62.5 MCG/INH Aepb Generic drug:  umeclidinium bromide INHALE ONE PUFF INTO THE LUNGS EVERY DAY   loratadine 10 MG tablet Commonly known as:  CLARITIN Take 10 mg by mouth daily.   metoprolol tartrate 50 MG tablet Commonly known as:  LOPRESSOR Take 75 mg by mouth 2 (two) times daily.   multivitamin  capsule Take 1 capsule by mouth daily.   OXYGEN 2Liters at night   polyethylene glycol-electrolytes 420 g solution Commonly known as:  TRILYTE Take 4,000 mLs by mouth as directed.   ranitidine 75 MG tablet Commonly known as:  ZANTAC Take 150 mg by mouth 2 (two) times daily.   sildenafil 100 MG tablet Commonly known as:  VIAGRA Take 100 mg by mouth daily as needed.   simvastatin 40 MG tablet Commonly known as:  ZOCOR Take 40 mg by mouth every evening.   Vitamin D3 2000 units capsule Take 2,000 Units by mouth daily.

## 2018-03-07 NOTE — Patient Instructions (Signed)
Will arrange for overnight oxygen test  Follow up in 1 year 

## 2018-03-28 ENCOUNTER — Encounter (INDEPENDENT_AMBULATORY_CARE_PROVIDER_SITE_OTHER): Payer: Medicare Other | Admitting: Ophthalmology

## 2018-03-28 DIAGNOSIS — H43813 Vitreous degeneration, bilateral: Secondary | ICD-10-CM | POA: Diagnosis not present

## 2018-03-28 DIAGNOSIS — I10 Essential (primary) hypertension: Secondary | ICD-10-CM

## 2018-03-28 DIAGNOSIS — H353211 Exudative age-related macular degeneration, right eye, with active choroidal neovascularization: Secondary | ICD-10-CM

## 2018-03-28 DIAGNOSIS — H35033 Hypertensive retinopathy, bilateral: Secondary | ICD-10-CM

## 2018-03-28 DIAGNOSIS — H353122 Nonexudative age-related macular degeneration, left eye, intermediate dry stage: Secondary | ICD-10-CM

## 2018-04-09 ENCOUNTER — Other Ambulatory Visit: Payer: Self-pay | Admitting: Pulmonary Disease

## 2018-05-23 ENCOUNTER — Encounter (INDEPENDENT_AMBULATORY_CARE_PROVIDER_SITE_OTHER): Payer: Medicare Other | Admitting: Ophthalmology

## 2018-05-23 DIAGNOSIS — H35033 Hypertensive retinopathy, bilateral: Secondary | ICD-10-CM

## 2018-05-23 DIAGNOSIS — H353211 Exudative age-related macular degeneration, right eye, with active choroidal neovascularization: Secondary | ICD-10-CM | POA: Diagnosis not present

## 2018-05-23 DIAGNOSIS — H43813 Vitreous degeneration, bilateral: Secondary | ICD-10-CM

## 2018-05-23 DIAGNOSIS — H353122 Nonexudative age-related macular degeneration, left eye, intermediate dry stage: Secondary | ICD-10-CM | POA: Diagnosis not present

## 2018-05-23 DIAGNOSIS — I10 Essential (primary) hypertension: Secondary | ICD-10-CM | POA: Diagnosis not present

## 2018-06-25 ENCOUNTER — Other Ambulatory Visit: Payer: Self-pay | Admitting: Pulmonary Disease

## 2018-07-25 ENCOUNTER — Encounter (INDEPENDENT_AMBULATORY_CARE_PROVIDER_SITE_OTHER): Payer: Medicare Other | Admitting: Ophthalmology

## 2018-07-25 DIAGNOSIS — I10 Essential (primary) hypertension: Secondary | ICD-10-CM

## 2018-07-25 DIAGNOSIS — H35033 Hypertensive retinopathy, bilateral: Secondary | ICD-10-CM | POA: Diagnosis not present

## 2018-07-25 DIAGNOSIS — H353211 Exudative age-related macular degeneration, right eye, with active choroidal neovascularization: Secondary | ICD-10-CM | POA: Diagnosis not present

## 2018-07-25 DIAGNOSIS — H353121 Nonexudative age-related macular degeneration, left eye, early dry stage: Secondary | ICD-10-CM

## 2018-07-25 DIAGNOSIS — H43813 Vitreous degeneration, bilateral: Secondary | ICD-10-CM

## 2018-10-03 ENCOUNTER — Encounter (INDEPENDENT_AMBULATORY_CARE_PROVIDER_SITE_OTHER): Payer: Medicare Other | Admitting: Ophthalmology

## 2018-10-03 DIAGNOSIS — H35033 Hypertensive retinopathy, bilateral: Secondary | ICD-10-CM | POA: Diagnosis not present

## 2018-10-03 DIAGNOSIS — H353121 Nonexudative age-related macular degeneration, left eye, early dry stage: Secondary | ICD-10-CM

## 2018-10-03 DIAGNOSIS — H353211 Exudative age-related macular degeneration, right eye, with active choroidal neovascularization: Secondary | ICD-10-CM | POA: Diagnosis not present

## 2018-10-03 DIAGNOSIS — I10 Essential (primary) hypertension: Secondary | ICD-10-CM | POA: Diagnosis not present

## 2018-10-03 DIAGNOSIS — H43813 Vitreous degeneration, bilateral: Secondary | ICD-10-CM

## 2018-11-05 ENCOUNTER — Telehealth: Payer: Self-pay | Admitting: Internal Medicine

## 2018-11-05 NOTE — Telephone Encounter (Signed)
Pt's wife called to set up colonoscopy for her husband. Does he need a NV or OV prior to scheduling? [phone] or [phone]

## 2018-11-06 NOTE — Telephone Encounter (Signed)
Please schedule nurse visit

## 2018-11-06 NOTE — Telephone Encounter (Signed)
NV made and letter mailed 

## 2018-11-29 ENCOUNTER — Ambulatory Visit (INDEPENDENT_AMBULATORY_CARE_PROVIDER_SITE_OTHER): Payer: Medicare Other

## 2018-11-29 DIAGNOSIS — Z86010 Personal history of colonic polyps: Secondary | ICD-10-CM

## 2018-11-29 MED ORDER — PEG 3350-KCL-NA BICARB-NACL 420 G PO SOLR: 4000.0000 mL | ORAL | 0 refills | Status: DC

## 2018-11-29 NOTE — Progress Notes (Signed)
Gastroenterology Pre-Procedure Review  Request Date:11/29/18 Requesting Physician: RMR recall last tcs 12/29/09 tubular adenoma  PATIENT REVIEW QUESTIONS: The patient responded to the following health history questions as indicated:    1. Diabetes Melitis: no 2. Joint replacements in the past 12 months: no 3. Major health problems in the past 3 months: no 4. Has an artificial valve or MVP: no 5. Has a defibrillator: no 6. Has been advised in past to take antibiotics in advance of a procedure like teeth cleaning: no 7. Family history of colon cancer: no  8. Alcohol Use: no 9. History of sleep apnea: no  10. History of coronary artery or other vascular stents placed within the last 12 months: no 11. History of any prior anesthesia complications: no    MEDICATIONS & ALLERGIES:    Patient reports the following regarding taking any blood thinners:   Plavix? yes (75mg ) Aspirin? yes (325mg ) Coumadin? no Brilinta? no Xarelto? no Eliquis? no Pradaxa? no Savaysa? no Effient? no  Patient confirms/reports the following medications:  Current Outpatient Medications  Medication Sig Dispense Refill  . albuterol (PROAIR HFA) 108 (90 Base) MCG/ACT inhaler INHALE TWO PUFFS BY MOUTH EVERY 6 HOURS AS NEEDED FOR FOR WHEEZING AND SHORTNESS OF BREATH 8.5 g 2  . amLODipine (NORVASC) 5 MG tablet Take 5 mg by mouth daily.    Marland Kitchen aspirin 325 MG tablet Take 325 mg by mouth daily.    Marland Kitchen atorvastatin (LIPITOR) 40 MG tablet Take 40 mg by mouth daily.  2  . bevacizumab (AVASTIN) 1.25 mg/0.1 mL SOLN 1.25 mg by Intravitreal route every 8 (eight) weeks.    . Cholecalciferol (VITAMIN D3) 2000 UNITS capsule Take 2,000 Units by mouth daily.    . clopidogrel (PLAVIX) 75 MG tablet Take 75 mg by mouth daily.    . famotidine (PEPCID) 10 MG tablet Take 10 mg by mouth 2 (two) times daily.    . hydrALAZINE (APRESOLINE) 50 MG tablet Take 50 mg by mouth 3 (three) times daily.     . INCRUSE ELLIPTA 62.5 MCG/INH AEPB inhale ONE  PUFF into lungs DAILY 30 each 10  . metoprolol (LOPRESSOR) 50 MG tablet Take 75 mg by mouth 2 (two) times daily.    . Multiple Vitamin (MULTIVITAMIN) capsule Take 1 capsule by mouth daily.    . Multiple Vitamins-Minerals (PRESERVISION AREDS 2 PO) Take by mouth.    . OXYGEN 2Liters at night    . trimethoprim-polymyxin b (POLYTRIM) ophthalmic solution every 4 (four) hours.     Current Facility-Administered Medications  Medication Dose Route Frequency Provider Last Rate Last Dose  . umeclidinium bromide (INCRUSE ELLIPTA) 62.5 MCG/INH 1 puff  1 puff Inhalation Daily Chesley Mires, MD        Patient confirms/reports the following allergies:  Allergies  Allergen Reactions  . Crestor [Rosuvastatin]     Right hip pain and weakness  . Tessalon [Benzonatate]     Weird feeling     No orders of the defined types were placed in this encounter.   AUTHORIZATION INFORMATION Primary Insurance: Blue medicare [place],  [place] #:[id number] Pre-Cert / Auth required: no   SCHEDULE INFORMATION: Procedure has been scheduled as follows:  Date: 01/30/19, Time: 10:30 Location: [facility] Dr[facility]  This Gastroenterology Pre-Precedure Review Form is being routed to the following provider(s): Roseanne Kaufman NP

## 2018-11-29 NOTE — Patient Instructions (Signed)
Hector Daniels   [birth year] MRN: [id number]    Procedure Date: 01/30/19 Time to register: 9:30am Place to register: [facility] Procedure Time: 10:30am Scheduled provider: R. Garfield Cornea, MD  PREPARATION FOR COLONOSCOPY WITH TRI-LYTE SPLIT PREP  Please notify us immediately if you are diabetic, take iron supplements, or if you are on Coumadin or any other blood thinners.   You will need to purchase 1 fleet enema and 1 box of Bisacodyl '5mg'$  tablets.   2 DAYS BEFORE PROCEDURE:  DATE: 01/28/19   DAY: Monday Begin clear liquid diet AFTER your lunch meal. NO SOLID FOODS after this point.  1 DAY BEFORE PROCEDURE:  DATE: 01/29/19   DAY: Tuesday Continue clear liquids the entire day - NO SOLID FOOD.   At 2:00 pm:  Take 2 Bisacodyl tablets.   At 4:00pm:  Start drinking your solution. Make sure you mix well per instructions on the bottle. Try to drink 1 (one) 8 ounce glass every 10-15 minutes until you have consumed HALF the jug. You should complete by 6:00pm.You must keep the left over solution refrigerated until completed next day.  Continue clear liquids. You must drink plenty of clear liquids to prevent dehyration and kidney failure.     DAY OF PROCEDURE:   DATE: 01/30/19   DAY: Wednesday If you take medications for your heart, blood pressure or breathing, you may take these medications.    Five hours before your procedure time @ 5:30am:  Finish remaining amout of bowel prep, drinking 1 (one) 8 ounce glass every 10-15 minutes until complete. You have two hours to consume remaining prep.   Three hours before your procedure time @ 7:30am:  Nothing by mouth.   At least one hour before going to the hospital:  Give yourself one Fleet enema.  You may take your morning medications with sip of water unless we have instructed otherwise.      Please see below for Dietary Information.  CLEAR LIQUIDS INCLUDE:  Water Jello (NOT red in color)   Ice Popsicles (NOT red in color)   Tea  (sugar ok, no milk/cream) Powdered fruit flavored drinks  Coffee (sugar ok, no milk/cream) Gatorade/ Lemonade/ Kool-Aid  (NOT red in color)   Juice: apple, white grape, white cranberry Soft drinks  Clear bullion, consomme, broth (fat free beef/chicken/vegetable)  Carbonated beverages (any kind)  Strained chicken noodle soup Hard Candy   Remember: Clear liquids are liquids that will allow you to see your fingers on the other side of a clear glass. Be sure liquids are NOT red in color, and not cloudy, but CLEAR.  DO NOT EAT OR DRINK ANY OF THE FOLLOWING:  Dairy products of any kind   Cranberry juice Tomato juice / V8 juice   Grapefruit juice Orange juice     Red grape juice  Do not eat any solid foods, including such foods as: cereal, oatmeal, yogurt, fruits, vegetables, creamed soups, eggs, bread, crackers, pureed foods in a blender, etc.   HELPFUL HINTS FOR DRINKING PREP SOLUTION:   Make sure prep is extremely cold. Mix and refrigerate the the morning of the prep. You may also put in the freezer.   You may try mixing some Crystal Light or Country Time Lemonade if you prefer. Mix in small amounts; add more if necessary.  Try drinking through a straw  Rinse mouth with water or a mouthwash between glasses, to remove after-taste.  Try sipping on a cold beverage /ice/ popsicles between glasses  of prep.  Place a piece of sugar-free hard candy in mouth between glasses.  If you become nauseated, try consuming smaller amounts, or stretch out the time between glasses. Stop for 30-60 minutes, then slowly start back drinking.        OTHER INSTRUCTIONS  You will need a responsible adult at least 73 years of age to accompany you and drive you home. This person must remain in the waiting room during your procedure. The hospital will cancel your procedure if you do not have a responsible adult with you.   1. Wear loose fitting clothing that is easily removed. 2. Leave jewelry and other  valuables at home.  3. Remove all body piercing jewelry and leave at home. 4. Total time from sign-in until discharge is approximately 2-3 hours. 5. You should go home directly after your procedure and rest. You can resume normal activities the day after your procedure. 6. The day of your procedure you should not:  Drive  Make legal decisions  Operate machinery  Drink alcohol  Return to work   You may call the office (Dept: [phone]) before 5:00pm, or page the doctor on call [phone]) after 5:00pm, for further instructions, if necessary.   Insurance Information YOU WILL NEED TO CHECK WITH YOUR INSURANCE COMPANY FOR THE BENEFITS OF COVERAGE YOU HAVE FOR THIS PROCEDURE.  UNFORTUNATELY, NOT ALL INSURANCE COMPANIES HAVE BENEFITS TO COVER ALL OR PART OF THESE TYPES OF PROCEDURES.  IT IS YOUR RESPONSIBILITY TO CHECK YOUR BENEFITS, HOWEVER, WE WILL BE GLAD TO ASSIST YOU WITH ANY CODES YOUR INSURANCE COMPANY MAY NEED.    PLEASE NOTE THAT MOST INSURANCE COMPANIES WILL NOT COVER A SCREENING COLONOSCOPY FOR PEOPLE UNDER THE AGE OF 50  IF YOU HAVE BCBS INSURANCE, YOU MAY HAVE BENEFITS FOR A SCREENING COLONOSCOPY BUT IF POLYPS ARE FOUND THE DIAGNOSIS WILL CHANGE AND THEN YOU MAY HAVE A DEDUCTIBLE THAT WILL NEED TO BE MET. SO PLEASE MAKE SURE YOU CHECK YOUR BENEFITS FOR A SCREENING COLONOSCOPY AS WELL AS A DIAGNOSTIC COLONOSCOPY.

## 2018-12-03 ENCOUNTER — Telehealth: Payer: Self-pay | Admitting: Pulmonary Disease

## 2018-12-03 NOTE — Telephone Encounter (Signed)
Called patient unable to reach left message to give us a call back.

## 2018-12-03 NOTE — Telephone Encounter (Signed)
Pt returning call [phone]

## 2018-12-03 NOTE — Progress Notes (Signed)
Just at night.

## 2018-12-03 NOTE — Progress Notes (Signed)
Does he require any oxygen during the day, or is this just at night?

## 2018-12-04 NOTE — Telephone Encounter (Signed)
Called and spoke with patient, he stated that he is needing a doctors note stating that he has COPD and has a greater risk of getting the Corona Virus due to his lung disorder. Patient has a flight at the end of May and he cannot get his money back unless he has a note from his doctor.   VS please advise, thank you.

## 2018-12-04 NOTE — Progress Notes (Signed)
I feel he would do best with monitored anesthesia care (Propofol) due to comorbidities.

## 2018-12-05 ENCOUNTER — Encounter: Payer: Self-pay | Admitting: Pulmonary Disease

## 2018-12-05 NOTE — Telephone Encounter (Signed)
Called and spoke with patient Advised letter is completed and signed He requested to have it mailed to him in the mail today Placed in mail Nothing further needed.

## 2018-12-05 NOTE — Progress Notes (Signed)
Please schedule ov and I will mail him a letter.

## 2018-12-05 NOTE — Telephone Encounter (Signed)
Letter completed and given to [facility].

## 2018-12-06 NOTE — Progress Notes (Signed)
Patient scheduled.

## 2018-12-10 NOTE — Progress Notes (Signed)
Letter mailed to the pt. Took him off the schedule.

## 2018-12-20 ENCOUNTER — Encounter (INDEPENDENT_AMBULATORY_CARE_PROVIDER_SITE_OTHER): Payer: Medicare Other | Admitting: Ophthalmology

## 2018-12-20 ENCOUNTER — Other Ambulatory Visit: Payer: Self-pay

## 2018-12-20 ENCOUNTER — Telehealth: Payer: Self-pay | Admitting: Pulmonary Disease

## 2018-12-20 DIAGNOSIS — H353211 Exudative age-related macular degeneration, right eye, with active choroidal neovascularization: Secondary | ICD-10-CM

## 2018-12-20 DIAGNOSIS — I10 Essential (primary) hypertension: Secondary | ICD-10-CM | POA: Diagnosis not present

## 2018-12-20 DIAGNOSIS — H353121 Nonexudative age-related macular degeneration, left eye, early dry stage: Secondary | ICD-10-CM

## 2018-12-20 DIAGNOSIS — H35033 Hypertensive retinopathy, bilateral: Secondary | ICD-10-CM

## 2018-12-20 DIAGNOSIS — H43813 Vitreous degeneration, bilateral: Secondary | ICD-10-CM

## 2018-12-20 NOTE — Telephone Encounter (Signed)
This has been placed in VS green to do folder.   VS please advise once completed and signed. Thank you

## 2018-12-20 NOTE — Telephone Encounter (Signed)
Patient walked into office. He needs his placard renewed. Dr. Halford Chessman is out of the office today. I will let his nurse Vida Roller know for when he returns to office. Placard expires on 02/24/2019.  Routing to [place] to follow up on

## 2018-12-24 NOTE — Telephone Encounter (Signed)
Please ask one of the NPs to review.

## 2018-12-24 NOTE — Telephone Encounter (Signed)
Called the patient to let him know the form was completed and signed. Patient agreed to have the form mailed to him. Copy made and sent to medical records to be scanned into chart.

## 2018-12-24 NOTE — Telephone Encounter (Signed)
Checked with Vida Roller and obtained DMV placard form. Will give to app of the day Aaron Edelman) for signature.

## 2018-12-24 NOTE — Telephone Encounter (Signed)
Form has been signed and handed back to Lujean Rave, [place].  This was signed into the basis of the patient's FEV1 being less than a liter.   Wyn Quaker FNP

## 2018-12-24 NOTE — Telephone Encounter (Signed)
Message routed to Wyn Quaker, NP  Aaron Edelman, please see the notations below regarding the handicap placard form. Thanks.

## 2019-02-21 ENCOUNTER — Ambulatory Visit: Payer: Medicare Other | Admitting: Gastroenterology

## 2019-03-11 ENCOUNTER — Ambulatory Visit: Payer: Medicare Other | Admitting: Pulmonary Disease

## 2019-03-14 ENCOUNTER — Encounter (INDEPENDENT_AMBULATORY_CARE_PROVIDER_SITE_OTHER): Payer: Medicare Other | Admitting: Ophthalmology

## 2019-03-14 ENCOUNTER — Other Ambulatory Visit: Payer: Self-pay

## 2019-03-14 DIAGNOSIS — I10 Essential (primary) hypertension: Secondary | ICD-10-CM

## 2019-03-14 DIAGNOSIS — H353121 Nonexudative age-related macular degeneration, left eye, early dry stage: Secondary | ICD-10-CM | POA: Diagnosis not present

## 2019-03-14 DIAGNOSIS — H35033 Hypertensive retinopathy, bilateral: Secondary | ICD-10-CM | POA: Diagnosis not present

## 2019-03-14 DIAGNOSIS — H353211 Exudative age-related macular degeneration, right eye, with active choroidal neovascularization: Secondary | ICD-10-CM

## 2019-03-14 DIAGNOSIS — H43813 Vitreous degeneration, bilateral: Secondary | ICD-10-CM

## 2019-03-28 ENCOUNTER — Other Ambulatory Visit: Payer: Self-pay

## 2019-03-28 ENCOUNTER — Encounter: Payer: Self-pay | Admitting: Gastroenterology

## 2019-03-28 ENCOUNTER — Ambulatory Visit: Payer: Medicare Other | Admitting: Gastroenterology

## 2019-03-28 DIAGNOSIS — Z86010 Personal history of colonic polyps: Secondary | ICD-10-CM | POA: Diagnosis not present

## 2019-03-28 MED ORDER — PEG 3350-KCL-NA BICARB-NACL 420 G PO SOLR: 4000.0000 mL | ORAL | 0 refills | Status: DC

## 2019-03-28 NOTE — Patient Instructions (Signed)
We have arranged a colonoscopy with Dr. Gala Romney in the near future.  Further recommendations to follow!   GO pirates!   It was a pleasure to see you today. I strive to create trusting relationships with patients to provide genuine, compassionate, and quality care. I value your feedback. If you receive a survey regarding your visit,  I greatly appreciate you taking time to fill this out.   Annitta Needs, PhD, ANP-BC [facility]

## 2019-03-28 NOTE — Progress Notes (Signed)
Referring Provider: Sandi Mealy, MD Primary Care Physician:  Sandi Mealy, MD  Primary GI: Dr. Gala Romney  Chief Complaint  Patient presents with  . Colonoscopy    no problems    HPI:   Hector Dai. is a 73 y.o. male presenting today with a history of small adenoma in 2011. He was due for surveillance colonoscopy a few years ago but had to postpone. Due to comorbidities, it was felt best to perform with Propofol.   On 2 liters Oxygen at night. Inhaler once daily. COPD. No abdominal pain, N/V. No dysphagia. Denying chronic GERD. No issues with weight loss or lack of appetite. No overt GI bleeding. At baseline for respiratory status.   Past Medical History:  Diagnosis Date  . Acute renal failure ([place])    Episode 12/13, prerenal, ACE inhibitor and diuretic discontinued  . Coronary atherosclerosis of native coronary artery    Moderate multivessel, s/p DES x 2 RCA 2006  . Essential hypertension, benign   . History of hematuria   . Mixed hyperlipidemia   . Tubular adenoma     Past Surgical History:  Procedure Laterality Date  . LAPAROSCOPIC APPENDECTOMY    . TONSILLECTOMY AND ADENOIDECTOMY    . VASECTOMY      Current Outpatient Medications  Medication Sig Dispense Refill  . albuterol (PROAIR HFA) 108 (90 Base) MCG/ACT inhaler INHALE TWO PUFFS BY MOUTH EVERY 6 HOURS AS NEEDED FOR FOR WHEEZING AND SHORTNESS OF BREATH 8.5 g 2  . amLODipine (NORVASC) 5 MG tablet Take 5 mg by mouth daily.    Marland Kitchen aspirin 325 MG tablet Take 325 mg by mouth daily.    . bevacizumab (AVASTIN) 1.25 mg/0.1 mL SOLN 1.25 mg by Intravitreal route every 8 (eight) weeks.    . Cholecalciferol (VITAMIN D3) 2000 UNITS capsule Take 2,000 Units by mouth daily.    . clopidogrel (PLAVIX) 75 MG tablet Take 75 mg by mouth daily.    . famotidine (PEPCID) 10 MG tablet Take 10 mg by mouth 2 (two) times daily.    . hydrALAZINE (APRESOLINE) 50 MG tablet Take 50 mg by mouth 3 (three) times daily.     . INCRUSE  ELLIPTA 62.5 MCG/INH AEPB inhale ONE PUFF into lungs DAILY 30 each 10  . metoprolol (LOPRESSOR) 50 MG tablet Take 75 mg by mouth 2 (two) times daily.    . Multiple Vitamin (MULTIVITAMIN) capsule Take 1 capsule by mouth daily.    . Multiple Vitamins-Minerals (PRESERVISION AREDS 2 PO) Take by mouth.    . OXYGEN 2Liters at night    . trimethoprim-polymyxin b (POLYTRIM) ophthalmic solution every 4 (four) hours.    Marland Kitchen atorvastatin (LIPITOR) 40 MG tablet Take 40 mg by mouth daily.  2  . polyethylene glycol-electrolytes (TRILYTE) 420 g solution Take 4,000 mLs by mouth as directed. 4000 mL 0   Current Facility-Administered Medications  Medication Dose Route Frequency Provider Last Rate Last Dose  . umeclidinium bromide (INCRUSE ELLIPTA) 62.5 MCG/INH 1 puff  1 puff Inhalation Daily Chesley Mires, MD        Allergies as of 03/28/2019 - Review Complete 03/28/2019  Allergen Reaction Noted  . Crestor [rosuvastatin]  10/17/2012  . Tessalon [benzonatate]  10/17/2012    Family History  Problem Relation Age of Onset  . Melanoma Father        Cause of death, age 79  . Heart disease Mother        Died age 77  .  Hyperlipidemia Brother     Social History   Socioeconomic History  . Marital status: Married    Spouse name: Not on file  . Number of children: Not on file  . Years of education: Not on file  . Highest education level: Not on file  Occupational History  . Not on file  Social Needs  . Financial resource strain: Not on file  . Food insecurity    Worry: Not on file    Inability: Not on file  . Transportation needs    Medical: Not on file    Non-medical: Not on file  Tobacco Use  . Smoking status: Former Smoker    Packs/day: 1.00    Years: 13.00    Pack years: 13.00    Types: Cigarettes    Start date: 09/26/1965    Quit date: 09/26/1978    Years since quitting: 40.5  . Smokeless tobacco: Never Used  . Tobacco comment: Quit x 20 years  Substance and Sexual Activity  . Alcohol use:  Yes    Comment: Socially  . Drug use: No  . Sexual activity: Not on file  Lifestyle  . Physical activity    Days per week: Not on file    Minutes per session: Not on file  . Stress: Not on file  Relationships  . Social Herbalist on phone: Not on file    Gets together: Not on file    Attends religious service: Not on file    Active member of club or organization: Not on file    Attends meetings of clubs or organizations: Not on file    Relationship status: Not on file  Other Topics Concern  . Not on file  Social History Narrative  . Not on file    Review of Systems: Gen: Denies fever, chills, anorexia. Denies fatigue, weakness, weight loss.  CV: Denies chest pain, palpitations, syncope, peripheral edema, and claudication. Resp: +DOE GI: Denies vomiting blood, jaundice, and fecal incontinence.   Denies dysphagia or odynophagia. Derm: Denies rash, itching, dry skin Psych: Denies depression, anxiety, memory loss, confusion. No homicidal or suicidal ideation.  Heme: Denies bruising, bleeding, and enlarged lymph nodes.  Physical Exam: BP (!) 181/76   Pulse 66   Temp 97.6 F (36.4 C) (Oral)   Ht 5\' 9"  (1.753 m)   Wt 233 lb (105.7 kg)   BMI 34.41 kg/m  General:   Alert and oriented. No distress noted. Pleasant and cooperative.  Head:  Normocephalic and atraumatic. Eyes:  Conjuctiva clear without scleral icterus. Mouth:  Oral mucosa pink and moist. Good dentition. No lesions. Lungs: mild expiratory wheeze bilaterally Cardiac: S1 S2 present without murmurs  Abdomen:  +BS, soft, non-tender and non-distended. Obese. No rebound or guarding. No HSM or masses noted. Msk:  Symmetrical without gross deformities. Normal posture. Extremities:  Without edema. Neurologic:  Alert and  oriented x4 Psych:  Alert and cooperative. Normal mood and affect.

## 2019-04-01 ENCOUNTER — Telehealth: Payer: Self-pay

## 2019-04-01 NOTE — Telephone Encounter (Signed)
TCS w/Propofol w/RMR was scheduled during OV for 05/09/19 at 10:30am. Pre-op appt 05/07/19 at 9:00am. Appt letter mailed with procedure instructions.

## 2019-04-01 NOTE — Progress Notes (Signed)
cc'ed to pcp °

## 2019-04-01 NOTE — Assessment & Plan Note (Signed)
73 year old very pleasant male with remote history of adenoma, due now for surveillance colonoscopy. No concerning lower or upper GI signs/symptoms. History of COPD with respiratory status at baseline, only requiring oxygen at night.   Proceed with TCS with Dr. Gala Romney in near future: the risks, benefits, and alternatives have been discussed with the patient in detail. The patient states understanding and desires to proceed. Propofol due to polypharmacy and comorbidities

## 2019-04-09 ENCOUNTER — Telehealth: Payer: Self-pay | Admitting: Internal Medicine

## 2019-04-09 NOTE — Telephone Encounter (Signed)
Patient called asking if should take his plavix before his procedure, he said the instructions he received had for him to call if he was on blood thinners.  He also stated he went over that in his pr appointment interview

## 2019-04-09 NOTE — Telephone Encounter (Signed)
Called and informed pt he doesn't need to hold Plavix before procedure.

## 2019-04-09 NOTE — Telephone Encounter (Signed)
OPENED IN ERROR

## 2019-04-25 ENCOUNTER — Ambulatory Visit: Payer: Medicare Other | Admitting: Pulmonary Disease

## 2019-04-25 ENCOUNTER — Encounter: Payer: Self-pay | Admitting: Pulmonary Disease

## 2019-04-25 ENCOUNTER — Other Ambulatory Visit: Payer: Self-pay

## 2019-04-25 VITALS — BP 116/78 | HR 61 | Temp 98.3°F | Ht 69.0 in | Wt 232.4 lb

## 2019-04-25 DIAGNOSIS — G4734 Idiopathic sleep related nonobstructive alveolar hypoventilation: Secondary | ICD-10-CM | POA: Diagnosis not present

## 2019-04-25 DIAGNOSIS — J432 Centrilobular emphysema: Secondary | ICD-10-CM | POA: Diagnosis not present

## 2019-04-25 DIAGNOSIS — Z23 Encounter for immunization: Secondary | ICD-10-CM

## 2019-04-25 NOTE — Patient Instructions (Signed)
Pneumonia 23 (pneumovax) shot today  Will arrange for overnight oxygen test and call you with results  Follow up in 1 year

## 2019-04-25 NOTE — Progress Notes (Signed)
[facility]  Chief Complaint  Patient presents with  . Follow-up    He reports his breathing has been good. He reports he has not been able to exercise due to covid so he notices he is SOB more frequently.     Constitutional:  BP 116/78   Pulse 61   Temp 98.3 F (36.8 C) (Oral)   Ht 5\' 9"  (1.753 m)   Wt 232 lb 6.4 oz (105.4 kg)   SpO2 98%   BMI 34.32 kg/m   Past Medical History:  HLD, HTN, CAD s/p DES  Brief Summary:  Hector Daniels. is a 73 y.o. male former smoker with COPD and chronic respiratory failure.  He has been doing well.  Not having cough, wheeze, sputum, chest pain, or hemoptysis.  Not having leg pain or swelling.  Keeps up with activities.  Still using 2 liters oxygen at night.  Never heard from DME about getting ONO done last year.  Not using albuterol much.   Physical Exam:   Appearance - well kempt   ENMT - clear nasal mucosa, midline nasal  septum, no oral exudates, no LAN, trachea midline  Respiratory - normal chest wall, normal respiratory effort, no accessory muscle use, no wheeze/rales  CV - s1s2 regular rate and rhythm, no murmurs, no peripheral edema, radial pulses symmetric  GI - soft, non tender, no masses  Lymph - no adenopathy noted in neck and axillary areas  MSK - normal gait  Ext - no cyanosis, clubbing, or joint inflammation noted  Skin - no rashes, lesions, or ulcers  Neuro - normal strength, oriented x 3  Psych - normal mood and affect   Assessment/Plan:   COPD with chronic bronchitis. - continue incruse and prn albuterol - pneumovax today  Chronic respiratory failure with hypoxia. - still using 2 liters at night - will arrange for ONO with RA to determine if he can d/c home oxygen   Patient Instructions  Pneumonia 23 (pneumovax) shot today  Will arrange for overnight oxygen test and call you with results  Follow up in 1 year    Chesley Mires, MD [place] Pager: [phone] 04/25/2019, 12:32 PM  Flow Sheet     Pulmonary tests:  V/Q 09/13/12 >>airtrapping  Spirometry 11/25/13 >>FEV1 0.74, FEV1% 35  Ambulatory oximetry RA 01/09/14 >> SpO2 80% after 1 lap A1AT 01/09/14 >> 161, PI-MM ONO with RA 03/03/15 >>test time 6 hrs 48 min. Baseline SpO2 92%, low SpO2 78%. Spent 52 min with SpO2 <88%.  Cardiac tests:  Echo 10/08/12 >>mild LVH, EF 40 to 45%   Medications:   Allergies as of 04/25/2019      Reactions   Crestor [rosuvastatin]    Right hip pain and weakness   Tessalon [benzonatate]    Weird feeling      Medication List       Accurate as of April 25, 2019 12:32 PM. If you have any questions, ask your nurse or doctor.        albuterol 108 (90 Base) MCG/ACT inhaler Commonly known as: ProAir HFA INHALE TWO PUFFS BY MOUTH EVERY 6 HOURS AS NEEDED FOR FOR WHEEZING AND SHORTNESS OF BREATH   amLODipine 5 MG tablet Commonly known as: NORVASC Take 5 mg by mouth daily.   aspirin 325 MG tablet Take 325 mg by mouth daily.   atorvastatin 40 MG tablet Commonly known as: LIPITOR Take 40 mg by mouth daily.   bevacizumab  1.25 mg/0.1 mL Soln Commonly known as: AVASTIN 1.25 mg by Intravitreal route every 8 (eight) weeks.   clopidogrel 75 MG tablet Commonly known as: PLAVIX Take 75 mg by mouth daily.   famotidine 10 MG tablet Commonly known as: PEPCID Take 10 mg by mouth 2 (two) times daily.   hydrALAZINE 50 MG tablet Commonly known as: APRESOLINE Take 50 mg by mouth 3 (three) times daily.   Incruse Ellipta 62.5 MCG/INH Aepb Generic drug: umeclidinium bromide inhale ONE PUFF into lungs DAILY   metoprolol tartrate 50 MG tablet Commonly known as: LOPRESSOR Take 75 mg by mouth 2 (two) times daily.   multivitamin capsule Take 1 capsule by mouth daily.   OXYGEN 2Liters at night   polyethylene glycol-electrolytes 420 g solution Commonly known as: TriLyte Take 4,000 mLs by mouth as directed.    polyethylene glycol-electrolytes 420 g solution Commonly known as: TriLyte Take 4,000 mLs by mouth as directed.   PRESERVISION AREDS 2 PO Take by mouth.   trimethoprim-polymyxin b ophthalmic solution Commonly known as: POLYTRIM every 4 (four) hours.   Vitamin D3 50 MCG (2000 UT) capsule Take 2,000 Units by mouth daily.       Past Surgical History:  He  has a past surgical history that includes Tonsillectomy and adenoidectomy; Vasectomy; Laparoscopic appendectomy; and Cholecystectomy.  Family History:  His family history includes Heart disease in his mother; Hyperlipidemia in his brother; Melanoma in his father.  Social History:  He  reports that he quit smoking about 40 years ago. His smoking use included cigarettes. He started smoking about 53 years ago. He has a 13.00 pack-year smoking history. He has never used smokeless tobacco. He reports current alcohol use. He reports that he does not use drugs.

## 2019-05-07 ENCOUNTER — Encounter (HOSPITAL_COMMUNITY)
Admission: RE | Admit: 2019-05-07 | Discharge: 2019-05-07 | Disposition: A | Discharge status: Home / Self Care | Payer: Medicare Other | Source: Ambulatory Visit | Attending: Internal Medicine | Admitting: Internal Medicine

## 2019-05-07 ENCOUNTER — Other Ambulatory Visit (HOSPITAL_COMMUNITY)
Admission: RE | Admit: 2019-05-07 | Discharge: 2019-05-07 | Disposition: A | Discharge status: Home / Self Care | Payer: Medicare Other | Source: Ambulatory Visit | Attending: Internal Medicine | Admitting: Internal Medicine

## 2019-05-07 ENCOUNTER — Other Ambulatory Visit: Payer: Self-pay

## 2019-05-07 DIAGNOSIS — Z1211 Encounter for screening for malignant neoplasm of colon: Secondary | ICD-10-CM | POA: Diagnosis not present

## 2019-05-07 DIAGNOSIS — Z955 Presence of coronary angioplasty implant and graft: Secondary | ICD-10-CM | POA: Diagnosis not present

## 2019-05-07 DIAGNOSIS — Z7982 Long term (current) use of aspirin: Secondary | ICD-10-CM | POA: Diagnosis not present

## 2019-05-07 DIAGNOSIS — Z86010 Personal history of colonic polyps: Secondary | ICD-10-CM | POA: Diagnosis present

## 2019-05-07 DIAGNOSIS — K5730 Diverticulosis of large intestine without perforation or abscess without bleeding: Secondary | ICD-10-CM | POA: Diagnosis not present

## 2019-05-07 DIAGNOSIS — D122 Benign neoplasm of ascending colon: Secondary | ICD-10-CM | POA: Diagnosis not present

## 2019-05-07 DIAGNOSIS — J449 Chronic obstructive pulmonary disease, unspecified: Secondary | ICD-10-CM | POA: Diagnosis not present

## 2019-05-07 DIAGNOSIS — D124 Benign neoplasm of descending colon: Secondary | ICD-10-CM | POA: Diagnosis not present

## 2019-05-07 DIAGNOSIS — Z888 Allergy status to other drugs, medicaments and biological substances status: Secondary | ICD-10-CM | POA: Diagnosis not present

## 2019-05-07 DIAGNOSIS — Z9981 Dependence on supplemental oxygen: Secondary | ICD-10-CM | POA: Diagnosis not present

## 2019-05-07 DIAGNOSIS — E782 Mixed hyperlipidemia: Secondary | ICD-10-CM | POA: Diagnosis not present

## 2019-05-07 DIAGNOSIS — Z79899 Other long term (current) drug therapy: Secondary | ICD-10-CM | POA: Diagnosis not present

## 2019-05-07 DIAGNOSIS — Z7902 Long term (current) use of antithrombotics/antiplatelets: Secondary | ICD-10-CM | POA: Diagnosis not present

## 2019-05-07 DIAGNOSIS — Z87891 Personal history of nicotine dependence: Secondary | ICD-10-CM | POA: Diagnosis not present

## 2019-05-07 DIAGNOSIS — Z20828 Contact with and (suspected) exposure to other viral communicable diseases: Secondary | ICD-10-CM | POA: Diagnosis not present

## 2019-05-07 DIAGNOSIS — I2510 Atherosclerotic heart disease of native coronary artery without angina pectoris: Secondary | ICD-10-CM | POA: Diagnosis not present

## 2019-05-07 DIAGNOSIS — I10 Essential (primary) hypertension: Secondary | ICD-10-CM | POA: Diagnosis not present

## 2019-05-07 DIAGNOSIS — K219 Gastro-esophageal reflux disease without esophagitis: Secondary | ICD-10-CM | POA: Diagnosis not present

## 2019-05-07 LAB — SARS CORONAVIRUS 2 (TAT 6-24 HRS): SARS Coronavirus 2: NEGATIVE

## 2019-05-09 ENCOUNTER — Telehealth: Payer: Self-pay | Admitting: Pulmonary Disease

## 2019-05-09 ENCOUNTER — Encounter (HOSPITAL_COMMUNITY): Payer: Self-pay

## 2019-05-09 ENCOUNTER — Ambulatory Visit (HOSPITAL_COMMUNITY): Payer: Medicare Other | Admitting: Anesthesiology

## 2019-05-09 ENCOUNTER — Ambulatory Visit (HOSPITAL_COMMUNITY)
Admission: RE | Admit: 2019-05-09 | Discharge: 2019-05-09 | Disposition: A | Discharge status: Home / Self Care | Payer: Medicare Other | Attending: Internal Medicine | Admitting: Internal Medicine

## 2019-05-09 ENCOUNTER — Other Ambulatory Visit: Payer: Self-pay

## 2019-05-09 ENCOUNTER — Encounter (HOSPITAL_COMMUNITY)
Admission: RE | Disposition: A | Discharge status: Home / Self Care | Payer: Self-pay | Source: Home / Self Care | Attending: Internal Medicine

## 2019-05-09 ENCOUNTER — Telehealth: Payer: Self-pay

## 2019-05-09 DIAGNOSIS — Z7902 Long term (current) use of antithrombotics/antiplatelets: Secondary | ICD-10-CM | POA: Insufficient documentation

## 2019-05-09 DIAGNOSIS — Z955 Presence of coronary angioplasty implant and graft: Secondary | ICD-10-CM | POA: Insufficient documentation

## 2019-05-09 DIAGNOSIS — Z20828 Contact with and (suspected) exposure to other viral communicable diseases: Secondary | ICD-10-CM | POA: Insufficient documentation

## 2019-05-09 DIAGNOSIS — Z1211 Encounter for screening for malignant neoplasm of colon: Secondary | ICD-10-CM | POA: Diagnosis not present

## 2019-05-09 DIAGNOSIS — Z86010 Personal history of colonic polyps: Secondary | ICD-10-CM | POA: Insufficient documentation

## 2019-05-09 DIAGNOSIS — K219 Gastro-esophageal reflux disease without esophagitis: Secondary | ICD-10-CM | POA: Insufficient documentation

## 2019-05-09 DIAGNOSIS — J449 Chronic obstructive pulmonary disease, unspecified: Secondary | ICD-10-CM | POA: Insufficient documentation

## 2019-05-09 DIAGNOSIS — D124 Benign neoplasm of descending colon: Secondary | ICD-10-CM | POA: Diagnosis not present

## 2019-05-09 DIAGNOSIS — Z87891 Personal history of nicotine dependence: Secondary | ICD-10-CM | POA: Insufficient documentation

## 2019-05-09 DIAGNOSIS — I10 Essential (primary) hypertension: Secondary | ICD-10-CM | POA: Insufficient documentation

## 2019-05-09 DIAGNOSIS — Z888 Allergy status to other drugs, medicaments and biological substances status: Secondary | ICD-10-CM | POA: Insufficient documentation

## 2019-05-09 DIAGNOSIS — K635 Polyp of colon: Secondary | ICD-10-CM

## 2019-05-09 DIAGNOSIS — Z7982 Long term (current) use of aspirin: Secondary | ICD-10-CM | POA: Insufficient documentation

## 2019-05-09 DIAGNOSIS — D122 Benign neoplasm of ascending colon: Secondary | ICD-10-CM | POA: Insufficient documentation

## 2019-05-09 DIAGNOSIS — K5730 Diverticulosis of large intestine without perforation or abscess without bleeding: Secondary | ICD-10-CM | POA: Insufficient documentation

## 2019-05-09 DIAGNOSIS — Z9981 Dependence on supplemental oxygen: Secondary | ICD-10-CM | POA: Insufficient documentation

## 2019-05-09 DIAGNOSIS — I2510 Atherosclerotic heart disease of native coronary artery without angina pectoris: Secondary | ICD-10-CM | POA: Insufficient documentation

## 2019-05-09 DIAGNOSIS — Z79899 Other long term (current) drug therapy: Secondary | ICD-10-CM | POA: Insufficient documentation

## 2019-05-09 DIAGNOSIS — E782 Mixed hyperlipidemia: Secondary | ICD-10-CM | POA: Insufficient documentation

## 2019-05-09 HISTORY — PX: POLYPECTOMY: SHX5525

## 2019-05-09 HISTORY — PX: COLONOSCOPY WITH PROPOFOL: SHX5780

## 2019-05-09 SURGERY — COLONOSCOPY WITH PROPOFOL
Anesthesia: General

## 2019-05-09 MED ORDER — CHLORHEXIDINE GLUCONATE CLOTH 2 % EX PADS: 6.0000 | MEDICATED_PAD | Freq: Once | CUTANEOUS | Status: DC

## 2019-05-09 MED ORDER — MIDAZOLAM HCL 2 MG/2ML IJ SOLN: 0.5000 mg | Freq: Once | INTRAMUSCULAR | Status: DC | PRN

## 2019-05-09 MED ORDER — KETAMINE HCL 50 MG/5ML IJ SOSY
PREFILLED_SYRINGE | INTRAMUSCULAR | Status: AC
  Filled 2019-05-09: qty 5

## 2019-05-09 MED ORDER — PROPOFOL 10 MG/ML IV BOLUS
INTRAVENOUS | Status: DC | PRN
  Administered 2019-05-09: 125 ug/kg/min via INTRAVENOUS
  Administered 2019-05-09: 10:00:00 via INTRAVENOUS

## 2019-05-09 MED ORDER — LACTATED RINGERS IV SOLN
INTRAVENOUS | Status: DC | PRN
  Administered 2019-05-09: 10:00:00 via INTRAVENOUS

## 2019-05-09 MED ORDER — PROMETHAZINE HCL 25 MG/ML IJ SOLN: 6.2500 mg | INTRAMUSCULAR | Status: DC | PRN

## 2019-05-09 MED ORDER — KETAMINE HCL 10 MG/ML IJ SOLN
INTRAMUSCULAR | Status: DC | PRN
  Administered 2019-05-09: 5 mg via INTRAVENOUS
  Administered 2019-05-09: 10 mg via INTRAVENOUS

## 2019-05-09 MED ORDER — HYDROMORPHONE HCL 1 MG/ML IJ SOLN: 0.2500 mg | INTRAMUSCULAR | Status: DC | PRN

## 2019-05-09 MED ORDER — LACTATED RINGERS IV SOLN: INTRAVENOUS | Status: DC

## 2019-05-09 MED ORDER — HYDROCODONE-ACETAMINOPHEN 7.5-325 MG PO TABS: 1.0000 | ORAL_TABLET | Freq: Once | ORAL | Status: DC | PRN

## 2019-05-09 NOTE — Telephone Encounter (Signed)
Pt called s/p his procedure today with RMR. He read the discharge sheet and wants to know if he will be able to take his ASA and Blood thinner? Per the instructions if you've had a polyp removed, resume in 7 days. Pt didn't want to go that long without his blood thinner if not needed. Pt also say that his discharge sheet says to d/c his inhaler Incruse Ellipta. Please advise on if pt should continue these meds.

## 2019-05-09 NOTE — Op Note (Signed)
[facility] Patient Name: Case Vassell Procedure Date: 05/09/2019 9:46 AM MRN: [id number] Date of Birth: 1946-08-14 Attending MD: Norvel Richards , MD CSN: [id number] Age: 73 Admit Type: Outpatient Procedure:                Colonoscopy Indications:              High risk colon cancer surveillance: Personal                            history of colonic polyps Providers:                Norvel Richards, MD, [place] B. Sharon Seller, RN,                            Raphael Gibney, Technician Referring MD:              Medicines:                Propofol per Anesthesia Complications:            No immediate complications. Estimated Blood Loss:     Estimated blood loss was minimal. Procedure:                After obtaining informed consent, the colonoscope                            was passed under direct vision. Throughout the                            procedure, the patient's blood pressure, pulse, and                            oxygen saturations were monitored continuously. The                            CF-HQ190L ([id number]) scope was introduced through                            the anus and advanced to the the cecum, identified                            by appendiceal orifice and ileocecal valve. Scope In: 10:15:50 AM Scope Out: 10:35:47 AM Scope Withdrawal Time: 0 hours 16 minutes 8 seconds  Total Procedure Duration: 0 hours 19 minutes 57 seconds  Findings:      The perianal and digital rectal examinations were normal.      Seven semi-pedunculated polyps were found in the descending colon and       ascending colon (6 in ascending and 1 and descending). The polyps were 4       to 7 mm in size. These polyps were removed with a cold snare. Resection       and retrieval were complete. Estimated blood loss was minimal.      Scattered medium-mouthed diverticula were found in the sigmoid colon and       descending colon.      The exam was otherwise without abnormality on direct and  retroflexion       views. Impression:               -  Seven 4 to 7 mm polyps in the descending colon                            and in the ascending colon, removed with a cold                            snare. Resected and retrieved.                           - Diverticulosis in the sigmoid colon and in the                            descending colon.                           - The examination was otherwise normal on direct                            and retroflexion views. Moderate Sedation:      Moderate (conscious) sedation was personally administered by an       anesthesia professional. The following parameters were monitored: oxygen       saturation, heart rate, blood pressure, respiratory rate, EKG, adequacy       of pulmonary ventilation, and response to care. Recommendation:           - Patient has a contact number available for                            emergencies. The signs and symptoms of potential                            delayed complications were discussed with the                            patient. Return to normal activities tomorrow.                            Written discharge instructions were provided to the                            patient.                           - Advance diet as tolerated.                           - Continue present medications.                           - Repeat colonoscopy date to be determined after                            pending pathology results are reviewed for                            surveillance.                           -  Return to GI office (date not yet determined). Procedure Code(s):        --- Professional ---                           [phone], Colonoscopy, flexible; with removal of                            tumor(s), polyp(s), or other lesion(s) by snare                            technique Diagnosis Code(s):        --- Professional ---                           Z86.010, Personal history of colonic polyps                            K63.5, Polyp of colon                           K57.30, Diverticulosis of large intestine without                            perforation or abscess without bleeding CPT copyright 2019 [employer]. All rights reserved. The codes documented in this report are preliminary and upon coder review may  be revised to meet current compliance requirements. Cristopher Estimable. Chelsia Serres, MD Norvel Richards, MD 05/09/2019 10:50:55 AM This report has been signed electronically. Number of Addenda: 0

## 2019-05-09 NOTE — Discharge Instructions (Signed)
Colonoscopy Discharge Instructions  Read the instructions outlined below and refer to this sheet in the next few weeks. These discharge instructions provide you with general information on caring for yourself after you leave the hospital. Your doctor may also give you specific instructions. While your treatment has been planned according to the most current medical practices available, unavoidable complications occasionally occur. If you have any problems or questions after discharge, call Dr. Gala Romney at [phone]. ACTIVITY  You may resume your regular activity, but move at a slower pace for the next 24 hours.   Take frequent rest periods for the next 24 hours.   Walking will help get rid of the air and reduce the bloated feeling in your belly (abdomen).   No driving for 24 hours (because of the medicine (anesthesia) used during the test).    Do not sign any important legal documents or operate any machinery for 24 hours (because of the anesthesia used during the test).  NUTRITION  Drink plenty of fluids.   You may resume your normal diet as instructed by your doctor.   Begin with a light meal and progress to your normal diet. Heavy or fried foods are harder to digest and may make you feel sick to your stomach (nauseated).   Avoid alcoholic beverages for 24 hours or as instructed.  MEDICATIONS  You may resume your normal medications unless your doctor tells you otherwise.  WHAT YOU CAN EXPECT TODAY  Some feelings of bloating in the abdomen.   Passage of more gas than usual.   Spotting of blood in your stool or on the toilet paper.  IF YOU HAD POLYPS REMOVED DURING THE COLONOSCOPY:  No aspirin products for 7 days or as instructed.   No alcohol for 7 days or as instructed.   Eat a soft diet for the next 24 hours.  FINDING OUT THE RESULTS OF YOUR TEST Not all test results are available during your visit. If your test results are not back during the visit, make an appointment  with your caregiver to find out the results. Do not assume everything is normal if you have not heard from your caregiver or the medical facility. It is important for you to follow up on all of your test results.  SEEK IMMEDIATE MEDICAL ATTENTION IF:  You have more than a spotting of blood in your stool.   Your belly is swollen (abdominal distention).   You are nauseated or vomiting.   You have a temperature over 101.   You have abdominal pain or discomfort that is severe or gets worse throughout the day.   Colon polyp and diverticulosis information provided  Check with prescribing physician regarding proper dose of Ellipta  Further recommendations to follow pending review of pathology report  I discussed findings and recommendations with patient's wife, at [phone]    Colon Polyps  Polyps are tissue growths inside the body. Polyps can grow in many places, including the large intestine (colon). A polyp may be a round bump or a mushroom-shaped growth. You could have one polyp or several. Most colon polyps are noncancerous (benign). However, some colon polyps can become cancerous over time. Finding and removing the polyps early can help prevent this. What are the causes? The exact cause of colon polyps is not known. What increases the risk? You are more likely to develop this condition if you:  Have a family history of colon cancer or colon polyps.  Are older than 50 or older than 45  if you are African American.  Have inflammatory bowel disease, such as ulcerative colitis or Crohn's disease.  Have certain hereditary conditions, such as: ? Familial adenomatous polyposis. ? Lynch syndrome. ? Turcot syndrome. ? Peutz-Jeghers syndrome.  Are overweight.  Smoke cigarettes.  Do not get enough exercise.  Drink too much alcohol.  Eat a diet that is high in fat and red meat and low in fiber.  Had childhood cancer that was treated with abdominal radiation. What are the  signs or symptoms? Most polyps do not cause symptoms. If you have symptoms, they may include:  Blood coming from your rectum when having a bowel movement.  Blood in your stool. The stool may look dark red or black.  Abdominal pain.  A change in bowel habits, such as constipation or diarrhea. How is this diagnosed? This condition is diagnosed with a colonoscopy. This is a procedure in which a lighted, flexible scope is inserted into the anus and then passed into the colon to examine the area. Polyps are sometimes found when a colonoscopy is done as part of routine cancer screening tests. How is this treated? Treatment for this condition involves removing any polyps that are found. Most polyps can be removed during a colonoscopy. Those polyps will then be tested for cancer. Additional treatment may be needed depending on the results of testing. Follow these instructions at home: Lifestyle  Maintain a healthy weight, or lose weight if recommended by your health care provider.  Exercise every day or as told by your health care provider.  Do not use any products that contain nicotine or tobacco, such as cigarettes and e-cigarettes. If you need help quitting, ask your health care provider.  If you drink alcohol, limit how much you have: ? 0-1 drink a day for women. ? 0-2 drinks a day for men.  Be aware of how much alcohol is in your drink. In the [place] one drink equals one 12 oz bottle of beer (355 mL), one 5 oz glass of wine (148 mL), or one 1 oz shot of hard liquor (44 mL). Eating and drinking   Eat foods that are high in fiber, such as fruits, vegetables, and whole grains.  Eat foods that are high in calcium and vitamin D, such as milk, cheese, yogurt, eggs, liver, fish, and broccoli.  Limit foods that are high in fat, such as fried foods and desserts.  Limit the amount of red meat and processed meat you eat, such as hot dogs, sausage, bacon, and lunch meats. General  instructions  Keep all follow-up visits as told by your health care provider. This is important. ? This includes having regularly scheduled colonoscopies. ? Talk to your health care provider about when you need a colonoscopy. Contact a health care provider if:  You have new or worsening bleeding during a bowel movement.  You have new or increased blood in your stool.  You have a change in bowel habits.  You lose weight for no known reason. Summary  Polyps are tissue growths inside the body. Polyps can grow in many places, including the colon.  Most colon polyps are noncancerous (benign), but some can become cancerous over time.  This condition is diagnosed with a colonoscopy.  Treatment for this condition involves removing any polyps that are found. Most polyps can be removed during a colonoscopy. This information is not intended to replace advice given to you by your health care provider. Make sure you discuss any questions you have with your  health care provider. Document Released: 06/08/2004 Document Revised: 12/28/2017 Document Reviewed: 12/28/2017 Elsevier Patient Education  2020 [employer].     Diverticulosis  Diverticulosis is a condition that develops when small pouches (diverticula) form in the wall of the large intestine (colon). The colon is where water is absorbed and stool is formed. The pouches form when the inside layer of the colon pushes through weak spots in the outer layers of the colon. You may have a few pouches or many of them. What are the causes? The cause of this condition is not known. What increases the risk? The following factors may make you more likely to develop this condition:  Being older than age 104. Your risk for this condition increases with age. Diverticulosis is rare among people younger than age 60. By age 36, many people have it.  Eating a low-fiber diet.  Having frequent constipation.  Being overweight.  Not getting enough  exercise.  Smoking.  Taking over-the-counter pain medicines, like aspirin and ibuprofen.  Having a family history of diverticulosis. What are the signs or symptoms? In most people, there are no symptoms of this condition. If you do have symptoms, they may include:  Bloating.  Cramps in the abdomen.  Constipation or diarrhea.  Pain in the lower left side of the abdomen. How is this diagnosed? This condition is most often diagnosed during an exam for other colon problems. Because diverticulosis usually has no symptoms, it often cannot be diagnosed independently. This condition may be diagnosed by:  Using a flexible scope to examine the colon (colonoscopy).  Taking an X-ray of the colon after dye has been put into the colon (barium enema).  Doing a CT scan. How is this treated? You may not need treatment for this condition if you have never developed an infection related to diverticulosis. If you have had an infection before, treatment may include:  Eating a high-fiber diet. This may include eating more fruits, vegetables, and grains.  Taking a fiber supplement.  Taking a live bacteria supplement (probiotic).  Taking medicine to relax your colon.  Taking antibiotic medicines. Follow these instructions at home:  Drink 6-8 glasses of water or more each day to prevent constipation.  Try not to strain when you have a bowel movement.  If you have had an infection before: ? Eat more fiber as directed by your health care provider or your diet and nutrition specialist (dietitian). ? Take a fiber supplement or probiotic, if your health care provider approves.  Take over-the-counter and prescription medicines only as told by your health care provider.  If you were prescribed an antibiotic, take it as told by your health care provider. Do not stop taking the antibiotic even if you start to feel better.  Keep all follow-up visits as told by your health care provider. This is  important. Contact a health care provider if:  You have pain in your abdomen.  You have bloating.  You have cramps.  You have not had a bowel movement in 3 days. Get help right away if:  Your pain gets worse.  Your bloating becomes very bad.  You have a fever or chills, and your symptoms suddenly get worse.  You vomit.  You have bowel movements that are bloody or black.  You have bleeding from your rectum. Summary  Diverticulosis is a condition that develops when small pouches (diverticula) form in the wall of the large intestine (colon).  You may have a few pouches or many of  them.  This condition is most often diagnosed during an exam for other colon problems.  If you have had an infection related to diverticulosis, treatment may include increasing the fiber in your diet, taking supplements, or taking medicines. This information is not intended to replace advice given to you by your health care provider. Make sure you discuss any questions you have with your health care provider. Document Released: 06/09/2004 Document Revised: 08/25/2017 Document Reviewed: 08/01/2016 Elsevier Patient Education  2020 [place] After These instructions provide you with information about caring for yourself after your procedure. Your health care provider may also give you more specific instructions. Your treatment has been planned according to current medical practices, but problems sometimes occur. Call your health care provider if you have any problems or questions after your procedure. What can I expect after the procedure? After your procedure, you may:  Feel sleepy for several hours.  Feel clumsy and have poor balance for several hours.  Feel forgetful about what happened after the procedure.  Have poor judgment for several hours.  Feel nauseous or vomit.  Have a sore throat if you had a breathing tube during the procedure. Follow these  instructions at home: For at least 24 hours after the procedure:      Have a responsible adult stay with you. It is important to have someone help care for you until you are awake and alert.  Rest as needed.  Do not: ? Participate in activities in which you could fall or become injured. ? Drive. ? Use heavy machinery. ? Drink alcohol. ? Take sleeping pills or medicines that cause drowsiness. ? Make important decisions or sign legal documents. ? Take care of children on your own. Eating and drinking  Follow the diet that is recommended by your health care provider.  If you vomit, drink water, juice, or soup when you can drink without vomiting.  Make sure you have little or no nausea before eating solid foods. General instructions  Take over-the-counter and prescription medicines only as told by your health care provider.  If you have sleep apnea, surgery and certain medicines can increase your risk for breathing problems. Follow instructions from your health care provider about wearing your sleep device: ? Anytime you are sleeping, including during daytime naps. ? While taking prescription pain medicines, sleeping medicines, or medicines that make you drowsy.  If you smoke, do not smoke without supervision.  Keep all follow-up visits as told by your health care provider. This is important. Contact a health care provider if:  You keep feeling nauseous or you keep vomiting.  You feel light-headed.  You develop a rash.  You have a fever. Get help right away if:  You have trouble breathing. Summary  For several hours after your procedure, you may feel sleepy and have poor judgment.  Have a responsible adult stay with you for at least 24 hours or until you are awake and alert. This information is not intended to replace advice given to you by your health care provider. Make sure you discuss any questions you have with your health care provider. Document Released:  01/03/2016 Document Revised: 12/11/2017 Document Reviewed: 01/03/2016 Elsevier Patient Education  2020 [employer].

## 2019-05-09 NOTE — Transfer of Care (Signed)
Immediate Anesthesia Transfer of Care Note  Patient: Hector Daniels.  Procedure(s) Performed: COLONOSCOPY WITH PROPOFOL (N/A ) POLYPECTOMY  Patient Location: PACU  Anesthesia Type:General  Level of Consciousness: awake and patient cooperative  Airway & Oxygen Therapy: Patient Spontanous Breathing and Patient connected to nasal cannula oxygen  Post-op Assessment: Report given to RN and Post -op Vital signs reviewed and stable  Post vital signs: Reviewed and stable  Last Vitals:  Vitals Value Taken Time  BP 129/59 05/09/19 1045  Temp    Pulse 64 05/09/19 1046  Resp 14 05/09/19 1046  SpO2 100 % 05/09/19 1046  Vitals shown include unvalidated device data.  Last Pain:  Vitals:   05/09/19 1009  TempSrc:   PainSc: 0-No pain         Complications: No apparent anesthesia complications

## 2019-05-09 NOTE — Telephone Encounter (Signed)
Called and spoke with pt. Pt said he had a colonoscopy performed today 8/13 and on the instructions he was given after procedure, it had on there for him to verify instructions for the Ellipta inhaler. I stated to pt that the Incruse is 1 puff once daily and he said he has been doing it just like that. Nothing further needed.

## 2019-05-09 NOTE — Anesthesia Postprocedure Evaluation (Signed)
Anesthesia Post Note  Patient: Hector Daniels.  Procedure(s) Performed: COLONOSCOPY WITH PROPOFOL (N/A ) POLYPECTOMY  Patient location during evaluation: PACU Anesthesia Type: General Level of consciousness: awake and alert and patient cooperative Pain management: pain level controlled Vital Signs Assessment: post-procedure vital signs reviewed and stable Respiratory status: spontaneous breathing and respiratory function stable Cardiovascular status: blood pressure returned to baseline Postop Assessment: no apparent nausea or vomiting Anesthetic complications: no     Last Vitals:  Vitals:   05/09/19 0927 05/09/19 1044  BP: (!) 187/85 (!) (P) 129/59  Pulse:  (P) 65  Resp:  (P) 18  Temp:  (!) (P) 36.4 C  SpO2:  (P) 100%    Last Pain:  Vitals:   05/09/19 1009  TempSrc:   PainSc: 0-No pain                 Jayvion Stefanski J

## 2019-05-09 NOTE — Telephone Encounter (Signed)
Continue aspirin and Plavix.  No interruption in treatment needed.  In the medication list, he had 2 different doses of Ellipta.  Unable to clarify.  Patient just needs to check with his prescribing doctor about the correct dose.

## 2019-05-09 NOTE — Telephone Encounter (Signed)
Left a detailed message for pt. Pt notified of medication treatment.

## 2019-05-09 NOTE — H&P (Signed)
@LOGO @   Primary Care Physician:  Sandi Mealy, MD Primary Gastroenterologist:  Dr. Gala Romney  Pre-Procedure History & Physical: HPI:  Hector Daniels. is a 73 y.o. male here for surveillance colonoscopy.  History of colonic adenoma removed 2011.  Doing well.  No symptoms.  Past Medical History:  Diagnosis Date  . Acute renal failure ([place])    Episode 12/13, prerenal, ACE inhibitor and diuretic discontinued  . COPD (chronic obstructive pulmonary disease) ([place])   . Coronary atherosclerosis of native coronary artery    Moderate multivessel, s/p DES x 2 RCA 2006  . Essential hypertension, benign   . History of hematuria   . Mixed hyperlipidemia   . Tubular adenoma     Past Surgical History:  Procedure Laterality Date  . CHOLECYSTECTOMY    . LAPAROSCOPIC APPENDECTOMY    . TONSILLECTOMY AND ADENOIDECTOMY    . VASECTOMY      Prior to Admission medications   Medication Sig Start Date End Date Taking? Authorizing Provider  amLODipine (NORVASC) 5 MG tablet Take 5 mg by mouth daily.   Yes [provider]  aspirin 325 MG tablet Take 325 mg by mouth daily.   Yes [provider]  atorvastatin (LIPITOR) 40 MG tablet Take 40 mg by mouth daily. 01/30/17  Yes [provider]  bevacizumab (AVASTIN) 1.25 mg/0.1 mL SOLN 1.25 mg by Intravitreal route every 30 (thirty) days.    Yes [provider]  cetirizine (ZYRTEC) 10 MG tablet Take 10 mg by mouth daily.   Yes [provider]  Cholecalciferol (VITAMIN D3) 2000 UNITS capsule Take 2,000 Units by mouth daily.   Yes [provider]  clopidogrel (PLAVIX) 75 MG tablet Take 75 mg by mouth daily.   Yes [provider]  famotidine (PEPCID) 10 MG tablet Take 10 mg by mouth 2 (two) times daily.   Yes [provider]  hydrALAZINE (APRESOLINE) 50 MG tablet Take 50 mg by mouth 3 (three) times daily.    Yes [provider]  INCRUSE ELLIPTA 62.5 MCG/INH AEPB inhale ONE PUFF into  lungs DAILY Patient taking differently: Inhale 1 puff into the lungs daily.  06/26/18  Yes Chesley Mires, MD  metoprolol (LOPRESSOR) 50 MG tablet Take 75 mg by mouth 2 (two) times daily.   Yes [provider]  Multiple Vitamin (MULTIVITAMIN) capsule Take 1 capsule by mouth daily.   Yes [provider]  Multiple Vitamins-Minerals (PRESERVISION AREDS 2 PO) Take 1 tablet by mouth 2 (two) times daily.    Yes [provider]  OXYGEN Inhale 2 L into the lungs at bedtime.    Yes [provider]  polyethylene glycol-electrolytes (TRILYTE) 420 g solution Take 4,000 mLs by mouth as directed. 11/29/18  Yes Annitta Needs, NP  polyethylene glycol-electrolytes (TRILYTE) 420 g solution Take 4,000 mLs by mouth as directed. 03/28/19  Yes Mertis Mosher, Cristopher Estimable, MD  trimethoprim-polymyxin b (POLYTRIM) ophthalmic solution Place 1 drop into the right eye 4 (four) times daily.    Yes [provider]  albuterol (PROAIR HFA) 108 (90 Base) MCG/ACT inhaler INHALE TWO PUFFS BY MOUTH EVERY 6 HOURS AS NEEDED FOR FOR WHEEZING AND SHORTNESS OF BREATH Patient taking differently: Inhale 2 puffs into the lungs every 6 (six) hours as needed for wheezing or shortness of breath. INHALE TWO PUFFS BY MOUTH EVERY 6 HOURS AS NEEDED FOR FOR WHEEZING AND SHORTNESS OF BREATH 01/15/18   Chesley Mires, MD    Allergies as of 03/28/2019 -  Review Complete 03/28/2019  Allergen Reaction Noted  . Crestor [rosuvastatin]  10/17/2012  . Tessalon [benzonatate]  10/17/2012    Family History  Problem Relation Age of Onset  . Melanoma Father        Cause of death, age 61  . Heart disease Mother        Died age 80  . Hyperlipidemia Brother   . Colon cancer Neg Hx   . Colon polyps Neg Hx     Social History   Socioeconomic History  . Marital status: Married    Spouse name: Not on file  . Number of children: Not on file  . Years of education: Not on file  . Highest education level: Not on file  Occupational  History  . Not on file  Social Needs  . Financial resource strain: Not on file  . Food insecurity    Worry: Not on file    Inability: Not on file  . Transportation needs    Medical: Not on file    Non-medical: Not on file  Tobacco Use  . Smoking status: Former Smoker    Packs/day: 1.00    Years: 13.00    Pack years: 13.00    Types: Cigarettes    Start date: 09/26/1965    Quit date: 09/26/1978    Years since quitting: 40.6  . Smokeless tobacco: Never Used  . Tobacco comment: Quit x 20 years  Substance and Sexual Activity  . Alcohol use: Yes    Comment: rarely/ socially  . Drug use: No  . Sexual activity: Not on file  Lifestyle  . Physical activity    Days per week: Not on file    Minutes per session: Not on file  . Stress: Not on file  Relationships  . Social Herbalist on phone: Not on file    Gets together: Not on file    Attends religious service: Not on file    Active member of club or organization: Not on file    Attends meetings of clubs or organizations: Not on file    Relationship status: Not on file  . Intimate partner violence    Fear of current or ex partner: Not on file    Emotionally abused: Not on file    Physically abused: Not on file    Forced sexual activity: Not on file  Other Topics Concern  . Not on file  Social History Narrative  . Not on file    Review of Systems: See HPI, otherwise negative ROS  Physical Exam: BP (!) 187/85   Pulse 68   Temp 98.2 F (36.8 C) (Oral)   Resp (!) 21   SpO2 96%  General:   Alert,  , pleasant and cooperative in NAD Neck:  Supple; no masses or thyromegaly. No significant cervical adenopathy. Lungs:  Clear throughout to auscultation.   No wheezes, crackles, or rhonchi. No acute distress. Heart:  Regular rate and rhythm; no murmurs, clicks, rubs,  or gallops. Abdomen: Non-distended, normal bowel sounds.  Soft and nontender without appreciable mass or hepatosplenomegaly.  Pulses:  Normal pulses  noted. Extremities:  Without clubbing or edema.  Impression/Plan: 73 year old gentleman here for surveillance colonoscopy.  Anesthesia will assist with sedation.  The risks, benefits, limitations, alternatives and imponderables have been reviewed with the patient. Questions have been answered. All parties are agreeable.      Notice: This dictation was prepared with Dragon dictation along with smaller phrase technology. Any transcriptional  errors that result from this process are unintentional and may not be corrected upon review.

## 2019-05-09 NOTE — Anesthesia Preprocedure Evaluation (Signed)
Anesthesia Evaluation  Patient identified by MRN, date of birth, ID band Patient awake    Reviewed: Allergy & Precautions, NPO status , Patient's Chart, lab work & pertinent test results, reviewed documented beta blocker date and time   Airway Mallampati: IV  TM Distance: >3 FB Neck ROM: Limited    Dental no notable dental hx. (+) Teeth Intact   Pulmonary shortness of breath and with exertion, COPD,  COPD inhaler and oxygen dependent, former smoker,    Pulmonary exam normal breath sounds clear to auscultation       Cardiovascular Exercise Tolerance: Good hypertension, Pt. on medications and Pt. on home beta blockers negative cardio ROS Normal cardiovascular examI Rhythm:Regular Rate:Normal     Neuro/Psych negative neurological ROS  negative psych ROS   GI/Hepatic Neg liver ROS, GERD  Medicated and Controlled,  Endo/Other  negative endocrine ROS  Renal/GU Renal InsufficiencyRenal disease  negative genitourinary   Musculoskeletal negative musculoskeletal ROS (+)   Abdominal   Peds negative pediatric ROS (+)  Hematology negative hematology ROS (+)   Anesthesia Other Findings   Reproductive/Obstetrics negative OB ROS                             Anesthesia Physical Anesthesia Plan  ASA: III  Anesthesia Plan: General   Post-op Pain Management:    Induction: Intravenous  PONV Risk Score and Plan: 2 and Propofol infusion, Treatment may vary due to age or medical condition and TIVA  Airway Management Planned: Nasal Cannula and Simple Face Mask  Additional Equipment:   Intra-op Plan:   Post-operative Plan:   Informed Consent: I have reviewed the patients History and Physical, chart, labs and discussed the procedure including the risks, benefits and alternatives for the proposed anesthesia with the patient or authorized representative who has indicated his/her understanding and  acceptance.     Dental advisory given  Plan Discussed with: CRNA  Anesthesia Plan Comments: (Plan Full PPE use  Plan GA with GETA as needed d/w pt -WTP with same after Q&A)        Anesthesia Quick Evaluation

## 2019-05-11 ENCOUNTER — Encounter: Payer: Self-pay | Admitting: Internal Medicine

## 2019-05-17 ENCOUNTER — Encounter (HOSPITAL_COMMUNITY): Payer: Self-pay | Admitting: Internal Medicine

## 2019-05-21 ENCOUNTER — Other Ambulatory Visit: Payer: Self-pay | Admitting: Pulmonary Disease

## 2019-06-20 ENCOUNTER — Encounter (INDEPENDENT_AMBULATORY_CARE_PROVIDER_SITE_OTHER): Payer: Medicare Other | Admitting: Ophthalmology

## 2019-06-20 ENCOUNTER — Other Ambulatory Visit: Payer: Self-pay

## 2019-06-20 DIAGNOSIS — H35033 Hypertensive retinopathy, bilateral: Secondary | ICD-10-CM

## 2019-06-20 DIAGNOSIS — I10 Essential (primary) hypertension: Secondary | ICD-10-CM

## 2019-06-20 DIAGNOSIS — H353121 Nonexudative age-related macular degeneration, left eye, early dry stage: Secondary | ICD-10-CM | POA: Diagnosis not present

## 2019-06-20 DIAGNOSIS — H43813 Vitreous degeneration, bilateral: Secondary | ICD-10-CM

## 2019-06-20 DIAGNOSIS — H353211 Exudative age-related macular degeneration, right eye, with active choroidal neovascularization: Secondary | ICD-10-CM

## 2019-07-22 ENCOUNTER — Other Ambulatory Visit: Payer: Self-pay | Admitting: Pulmonary Disease

## 2019-09-10 ENCOUNTER — Telehealth: Payer: Self-pay | Admitting: Pulmonary Disease

## 2019-09-10 MED ORDER — ALBUTEROL SULFATE HFA 108 (90 BASE) MCG/ACT IN AERS: INHALATION_SPRAY | RESPIRATORY_TRACT | 2 refills | Status: DC

## 2019-09-10 NOTE — Telephone Encounter (Signed)
Pt requesting refill on rescue inhaler.  This has been sent to pharmacy as requested.  Nothing further needed at this time- will close encounter.

## 2019-09-18 ENCOUNTER — Other Ambulatory Visit: Payer: Self-pay | Admitting: Pulmonary Disease

## 2019-10-10 ENCOUNTER — Encounter (INDEPENDENT_AMBULATORY_CARE_PROVIDER_SITE_OTHER): Payer: Medicare Other | Admitting: Ophthalmology

## 2019-10-10 DIAGNOSIS — H353211 Exudative age-related macular degeneration, right eye, with active choroidal neovascularization: Secondary | ICD-10-CM

## 2019-10-10 DIAGNOSIS — H353121 Nonexudative age-related macular degeneration, left eye, early dry stage: Secondary | ICD-10-CM | POA: Diagnosis not present

## 2019-10-10 DIAGNOSIS — H35033 Hypertensive retinopathy, bilateral: Secondary | ICD-10-CM | POA: Diagnosis not present

## 2019-10-10 DIAGNOSIS — I10 Essential (primary) hypertension: Secondary | ICD-10-CM | POA: Diagnosis not present

## 2019-10-10 DIAGNOSIS — H43813 Vitreous degeneration, bilateral: Secondary | ICD-10-CM

## 2020-02-13 ENCOUNTER — Other Ambulatory Visit: Payer: Self-pay

## 2020-02-13 ENCOUNTER — Encounter (INDEPENDENT_AMBULATORY_CARE_PROVIDER_SITE_OTHER): Payer: Medicare Other | Admitting: Ophthalmology

## 2020-02-13 DIAGNOSIS — I10 Essential (primary) hypertension: Secondary | ICD-10-CM

## 2020-02-13 DIAGNOSIS — H353121 Nonexudative age-related macular degeneration, left eye, early dry stage: Secondary | ICD-10-CM

## 2020-02-13 DIAGNOSIS — H35033 Hypertensive retinopathy, bilateral: Secondary | ICD-10-CM

## 2020-02-13 DIAGNOSIS — H353211 Exudative age-related macular degeneration, right eye, with active choroidal neovascularization: Secondary | ICD-10-CM | POA: Diagnosis not present

## 2020-02-13 DIAGNOSIS — H43813 Vitreous degeneration, bilateral: Secondary | ICD-10-CM

## 2020-04-21 ENCOUNTER — Other Ambulatory Visit: Payer: Self-pay | Admitting: Pulmonary Disease

## 2020-06-25 ENCOUNTER — Other Ambulatory Visit: Payer: Self-pay

## 2020-06-25 ENCOUNTER — Encounter (INDEPENDENT_AMBULATORY_CARE_PROVIDER_SITE_OTHER): Payer: Medicare Other | Admitting: Ophthalmology

## 2020-06-25 DIAGNOSIS — H353121 Nonexudative age-related macular degeneration, left eye, early dry stage: Secondary | ICD-10-CM

## 2020-06-25 DIAGNOSIS — I10 Essential (primary) hypertension: Secondary | ICD-10-CM | POA: Diagnosis not present

## 2020-06-25 DIAGNOSIS — H35033 Hypertensive retinopathy, bilateral: Secondary | ICD-10-CM

## 2020-06-25 DIAGNOSIS — H353211 Exudative age-related macular degeneration, right eye, with active choroidal neovascularization: Secondary | ICD-10-CM

## 2020-06-25 DIAGNOSIS — H43813 Vitreous degeneration, bilateral: Secondary | ICD-10-CM

## 2020-06-30 ENCOUNTER — Encounter: Payer: Self-pay | Admitting: Pulmonary Disease

## 2020-06-30 ENCOUNTER — Other Ambulatory Visit: Payer: Self-pay

## 2020-06-30 ENCOUNTER — Ambulatory Visit (INDEPENDENT_AMBULATORY_CARE_PROVIDER_SITE_OTHER): Payer: Medicare Other | Admitting: Pulmonary Disease

## 2020-06-30 VITALS — BP 136/86 | HR 64 | Temp 97.3°F | Ht 69.0 in | Wt 234.2 lb

## 2020-06-30 DIAGNOSIS — G4734 Idiopathic sleep related nonobstructive alveolar hypoventilation: Secondary | ICD-10-CM | POA: Diagnosis not present

## 2020-06-30 DIAGNOSIS — J432 Centrilobular emphysema: Secondary | ICD-10-CM

## 2020-06-30 NOTE — Progress Notes (Signed)
[facility]  Chief Complaint  Patient presents with  . Follow-up    shortness of breath with exertion    Constitutional:  BP 136/86 (BP Location: Right Arm, Cuff Size: Normal)   Pulse 64   Temp (!) 97.3 F (36.3 C) (Other (Comment))   Ht 5\' 9"  (1.753 m)   Wt 234 lb 3.2 oz (106.2 kg)   SpO2 95% Comment: room air  BMI 34.59 kg/m   Past Medical History:  HLD, HTN, CAD s/p DES  Past Surgical History:  His  has a past surgical history that includes Tonsillectomy and adenoidectomy; Vasectomy; Laparoscopic appendectomy; Cholecystectomy; Colonoscopy with propofol (N/A, 05/09/2019); and polypectomy (05/09/2019).  Brief Summary:  Hector Alvillar. is a 74 y.o. male former smoker with COPD and chronic respiratory failure.      Subjective:   He has noticed feeling more short of breath when walking, especially up stairs or a hill.  Not having as much cough or chest congestion with the season change.  Denies fever, hemoptysis, weight loss, chest pain, or leg swelling.  Uses 2 liters oxygen at night, but hasn't needed to use previously during the day.  Received Moderna COVID vaccine.  Received influenza vaccine last month.  He maintained SpO2 > 91% on room today after walking 3 laps.  Physical Exam:   Appearance - well kempt   ENMT - no sinus tenderness, no oral exudate, no LAN, Mallampati 3 airway, no stridor  Respiratory - equal breath sounds bilaterally, no wheezing or rales  CV - s1s2 regular rate and rhythm, no murmurs  Ext - no clubbing, no edema  Skin - no rashes  Psych - normal mood and affect    Pulmonary testing:   Spirometry 11/25/13 >>FEV1 0.74, FEV1% 35   Ambulatory oximetry RA 01/09/14 >> SpO2 80% after 1 lap  A1AT 01/09/14 >> 161, PI-MM  Chest Imaging:   V/Q 09/13/12 >>airtrapping   Sleep Tests:   ONO with RA 03/03/15 >>test time 6 hrs 48 min. Baseline SpO2 92%, low SpO2 78%. Spent 52 min with SpO2  <88%.  Cardiac Tests:   Echo 10/08/12 >>mild LVH, EF 40 to 45%  Social History:  He  reports that he quit smoking about 41 years ago. His smoking use included cigarettes. He started smoking about 54 years ago. He has a 13.00 pack-year smoking history. He has never used smokeless tobacco. He reports current alcohol use. He reports that he does not use drugs.  Family History:  His family history includes Heart disease in his mother; Hyperlipidemia in his brother; Melanoma in his father.     Assessment/Plan:   COPD with chronic bronchitis. - continue incruse and prn albuterol for now  Chronic respiratory failure with hypoxia. - continue 2 liters oxygen at night  Deconditioning. - suspect this is playing role in his shortness of breath - encouraged him to maintain a regular exercise program  Time Spent Involved in Patient Care on Day of Examination:  33 minutes  Follow up:  Patient Instructions  Follow up in 6 months   Medication List:   Allergies as of 06/30/2020      Reactions   Crestor [rosuvastatin]    Right hip pain and weakness   Tessalon [benzonatate]    Weird feeling      Medication List       Accurate as of June 30, 2020  9:43 AM. If you have any questions, ask your nurse or doctor.  STOP taking these medications   polyethylene glycol-electrolytes 420 g solution Commonly known as: TriLyte Stopped by: Chesley Mires, MD     TAKE these medications   albuterol 108 (90 Base) MCG/ACT inhaler Commonly known as: ProAir HFA INHALE TWO PUFFS BY MOUTH EVERY 6 HOURS AS NEEDED FOR FOR WHEEZING AND SHORTNESS OF BREATH   amLODipine 5 MG tablet Commonly known as: NORVASC Take 5 mg by mouth daily.   aspirin 325 MG tablet Take 325 mg by mouth daily.   atorvastatin 40 MG tablet Commonly known as: LIPITOR Take 40 mg by mouth daily.   bevacizumab 1.25 mg/0.1 mL Soln Commonly known as: AVASTIN 1.25 mg by Intravitreal route every 30 (thirty) days.    cetirizine 10 MG tablet Commonly known as: ZYRTEC Take 10 mg by mouth daily.   clopidogrel 75 MG tablet Commonly known as: PLAVIX Take 75 mg by mouth daily.   famotidine 10 MG tablet Commonly known as: PEPCID Take 10 mg by mouth 2 (two) times daily.   hydrALAZINE 50 MG tablet Commonly known as: APRESOLINE Take 50 mg by mouth 3 (three) times daily.   Incruse Ellipta 62.5 MCG/INH Aepb Generic drug: umeclidinium bromide INHALE ONE PUFF INTO THE LUNGS DAILY   metoprolol tartrate 50 MG tablet Commonly known as: LOPRESSOR Take 75 mg by mouth 2 (two) times daily.   multivitamin capsule Take 1 capsule by mouth daily.   OXYGEN Inhale 2 L into the lungs at bedtime.   PRESERVISION AREDS 2 PO Take 1 tablet by mouth 2 (two) times daily.   trimethoprim-polymyxin b ophthalmic solution Commonly known as: POLYTRIM Place 1 drop into the right eye 4 (four) times daily.   Vitamin D3 50 MCG (2000 UT) capsule Take 2,000 Units by mouth daily.       Signature:  Chesley Mires, MD [place] Pager - [phone] 06/30/2020, 9:43 AM

## 2020-06-30 NOTE — Patient Instructions (Signed)
Follow up in 6 months 

## 2020-11-12 ENCOUNTER — Encounter (INDEPENDENT_AMBULATORY_CARE_PROVIDER_SITE_OTHER): Payer: Medicare Other | Admitting: Ophthalmology

## 2020-11-12 ENCOUNTER — Other Ambulatory Visit: Payer: Self-pay

## 2020-11-12 DIAGNOSIS — H353122 Nonexudative age-related macular degeneration, left eye, intermediate dry stage: Secondary | ICD-10-CM

## 2020-11-12 DIAGNOSIS — H353211 Exudative age-related macular degeneration, right eye, with active choroidal neovascularization: Secondary | ICD-10-CM | POA: Diagnosis not present

## 2020-11-12 DIAGNOSIS — H35033 Hypertensive retinopathy, bilateral: Secondary | ICD-10-CM | POA: Diagnosis not present

## 2020-11-12 DIAGNOSIS — I10 Essential (primary) hypertension: Secondary | ICD-10-CM

## 2020-11-12 DIAGNOSIS — H43813 Vitreous degeneration, bilateral: Secondary | ICD-10-CM

## 2021-01-19 NOTE — Progress Notes (Signed)
@Patient  ID: Hector Daniels., male    DOB: [birth year], 75 y.o.   MRN: [id number]  Chief Complaint  Patient presents with  . Follow-up    Sob on exertion worse at times, trying to stay active    Referring provider: Leeanne Rio, MD  HPI: 75 year old male, former smoker quit 1980 (13-pack-year history).  Past medical history significant for centrilobular emphysema, COPD, chronic respiratory failure (O2 dependent), hypertension, hyperlipidemia, coronary artery disease status post DES.  Patient of Dr. Halford Chessman on 06/30/2020.  Maintained on Incruse and as needed albuterol now.  01/21/2021 Patient presents today for 63-month follow-up. He is doing well, reports that his shortness of breath is some worse with activity. He is trying to stay active. He recently starting going to the gym three days a week with serior group. He does his own errands and shopping. He will have dyspnea with strenuous activity such as climbing stairs or mowing lawn. He is compliant with Incruse and feels it works well but is wondering if there is a better inhaler. He is not using his rescue inhaler much, only time is when he is at the gym. CAT score 8. Denies cough, chest tightness, wheezing.   Pulmonary testing  Spirometry 11/25/13 >>FEV1 0.74, FEV1% 35   Ambulatory oximetry RA 01/09/14 >> SpO2 80% after 1 lap  A1AT 01/09/14 >> 161, PI-MM  Sleep test  ONO with RA 03/03/15 >>test time 6 hrs 48 min. Baseline SpO2 92%, low SpO2 78%. Spent 52 min with SpO2 <88%.  Cardiac test  Echo 10/08/12 >>mild LVH, EF 40 to 45%  Allergies  Allergen Reactions  . Crestor [Rosuvastatin]     Right hip pain and weakness  . Tessalon [Benzonatate]     Weird feeling     Immunization History  Administered Date(s) Administered  . Fluad Quad(high Dose 65+) 07/16/2020  . Influenza Split 06/26/2013, 07/07/2017  . Influenza, High Dose Seasonal PF 06/26/2016  . Influenza,inj,Quad PF,6+ Mos 06/13/2014, 06/27/2015  . Moderna  Sars-Covid-2 Vaccination 10/31/2019, 11/28/2019, 07/29/2020  . Pneumococcal Conjugate-13 09/27/2011, 07/15/2015  . Pneumococcal Polysaccharide-23 04/25/2019  . Zoster 06/26/2016  . Zoster Recombinat (Shingrix) 01/29/2018, 07/12/2018    Past Medical History:  Diagnosis Date  . Acute renal failure ([place])    Episode 12/13, prerenal, ACE inhibitor and diuretic discontinued  . COPD (chronic obstructive pulmonary disease) ([place])   . Coronary atherosclerosis of native coronary artery    Moderate multivessel, s/p DES x 2 RCA 2006  . Essential hypertension, benign   . History of hematuria   . Mixed hyperlipidemia   . Tubular adenoma     Tobacco History: Social History   Tobacco Use  Smoking Status Former Smoker  . Packs/day: 1.00  . Years: 13.00  . Pack years: 13.00  . Types: Cigarettes  . Start date: 09/26/1965  . Quit date: 09/26/1978  . Years since quitting: 42.3  Smokeless Tobacco Never Used  Tobacco Comment   Quit x 20 years   Counseling given: Not Answered Comment: Quit x 20 years   Outpatient Medications Prior to Visit  Medication Sig Dispense Refill  . albuterol (PROAIR HFA) 108 (90 Base) MCG/ACT inhaler INHALE TWO PUFFS BY MOUTH EVERY 6 HOURS AS NEEDED FOR FOR WHEEZING AND SHORTNESS OF BREATH 8.5 g 2  . amLODipine (NORVASC) 5 MG tablet Take 5 mg by mouth daily.    Marland Kitchen aspirin EC 81 MG tablet Take 81 mg by mouth daily. Swallow whole.    Marland Kitchen atorvastatin (LIPITOR) 40  MG tablet Take 40 mg by mouth daily.  2  . bevacizumab (AVASTIN) 1.25 mg/0.1 mL SOLN 1.25 mg by Intravitreal route every 30 (thirty) days.     . Cholecalciferol (VITAMIN D3) 2000 UNITS capsule Take 2,000 Units by mouth daily.    . clopidogrel (PLAVIX) 75 MG tablet Take 75 mg by mouth daily.    . famotidine (PEPCID) 10 MG tablet Take 10 mg by mouth 2 (two) times daily.    . hydrALAZINE (APRESOLINE) 50 MG tablet Take 50 mg by mouth 3 (three) times daily.     . INCRUSE ELLIPTA 62.5 MCG/INH AEPB INHALE ONE PUFF INTO  THE LUNGS DAILY 30 each 0  . metoprolol (LOPRESSOR) 50 MG tablet Take 75 mg by mouth 2 (two) times daily.    . Multiple Vitamin (MULTIVITAMIN) capsule Take 1 capsule by mouth daily.    . Multiple Vitamins-Minerals (PRESERVISION AREDS 2 PO) Take 1 tablet by mouth 2 (two) times daily.     . OXYGEN Inhale 2 L into the lungs at bedtime.     Marland Kitchen trimethoprim-polymyxin b (POLYTRIM) ophthalmic solution Place 1 drop into the right eye 4 (four) times daily.     Marland Kitchen aspirin 325 MG tablet Take 325 mg by mouth daily. (Patient not taking: Reported on 01/21/2021)    . cetirizine (ZYRTEC) 10 MG tablet Take 10 mg by mouth daily. (Patient not taking: Reported on 01/21/2021)     No facility-administered medications prior to visit.    Review of Systems  Review of Systems  Constitutional: Positive for fatigue.  HENT: Negative.   Respiratory: Positive for shortness of breath. Negative for cough, chest tightness and wheezing.   Cardiovascular: Negative.    Physical Exam  BP (!) 158/70 (BP Location: Right Arm, Cuff Size: Normal)   Pulse 63   Temp (!) 97.5 F (36.4 C) (Temporal)   Ht 5\' 9"  (1.753 m)   Wt 228 lb 9.6 oz (103.7 kg)   SpO2 96%   BMI 33.76 kg/m  Physical Exam Constitutional:      Appearance: Normal appearance.  HENT:     Head: Normocephalic and atraumatic.     Mouth/Throat:     Mouth: Mucous membranes are moist.     Pharynx: Oropharynx is clear.  Cardiovascular:     Rate and Rhythm: Normal rate and regular rhythm.  Pulmonary:     Effort: Pulmonary effort is normal.     Breath sounds: Normal breath sounds.     Comments: CTA- diminished  Skin:    General: Skin is warm and dry.  Neurological:     General: No focal deficit present.     Mental Status: He is alert and oriented to person, place, and time. Mental status is at baseline.  Psychiatric:        Mood and Affect: Mood normal.        Behavior: Behavior normal.        Thought Content: Thought content normal.        Judgment:  Judgment normal.      Lab Results:  CBC    Component Value Date/Time   WBC 10.2 01/09/2014 1601   RBC 4.98 01/09/2014 1601   HGB 14.6 01/09/2014 1601   HCT 43.5 01/09/2014 1601   PLT 198.0 01/09/2014 1601   MCV 87.3 01/09/2014 1601   MCHC 33.7 01/09/2014 1601   RDW 13.3 01/09/2014 1601    BMET    Component Value Date/Time   NA 139 01/09/2014 1601   K  4.6 01/09/2014 1601   CL 98 01/09/2014 1601   CO2 34 (H) 01/09/2014 1601   GLUCOSE 101 (H) 01/09/2014 1601   BUN 15 01/09/2014 1601   CREATININE 0.9 01/09/2014 1601   CALCIUM 9.1 01/09/2014 1601    BNP No results found for: BNP  ProBNP No results found for: PROBNP  Imaging: No results found.   Assessment & Plan:   COPD with chronic bronchitis - Symptoms appear fairly well controlled but patient experiences moderate amount of dyspnea with strenuous activities. No recent exacerbations or hospitalizations. Rare SABA. CAT score 8.  - D/t severity of COPD on pulmonary function testing and activity intolerance recommend trial LABA/LAMA. Patient will be given sample of Anoro Ellipta one puff daily  - FU in 2 week televisit      Martyn Ehrich, NP 01/21/2021

## 2021-01-21 ENCOUNTER — Other Ambulatory Visit: Payer: Self-pay

## 2021-01-21 ENCOUNTER — Encounter: Payer: Self-pay | Admitting: Primary Care

## 2021-01-21 ENCOUNTER — Ambulatory Visit: Payer: Medicare Other | Admitting: Primary Care

## 2021-01-21 DIAGNOSIS — J449 Chronic obstructive pulmonary disease, unspecified: Secondary | ICD-10-CM

## 2021-01-21 MED ORDER — ANORO ELLIPTA 62.5-25 MCG/INH IN AEPB: 1.0000 | INHALATION_SPRAY | Freq: Every day | RESPIRATORY_TRACT | 0 refills | Status: DC

## 2021-01-21 NOTE — Assessment & Plan Note (Addendum)
-   Symptoms appear fairly well controlled but patient experiences moderate amount of dyspnea with strenuous activities. No recent exacerbations or hospitalizations. Rare SABA. CAT score 8.  - D/t severity of COPD on pulmonary function testing and activity intolerance recommend trial LABA/LAMA. Patient will be given sample of Anoro Ellipta one puff daily  - FU in 2 week televisit

## 2021-01-21 NOTE — Patient Instructions (Addendum)
Recommendations: - Stop Incruse - Start Anoro one puff daily in am - Use albuterol rescue inhaler 2 puffs every 4-6 hours for Breakthrough shortness of breath/wheezing/chest tightness  - Continue to stay as active as possible  Follow-up: - 2 week televisit with Beth (assess effectiveness of new inhaler)     COPD and Physical Activity Chronic obstructive pulmonary disease (COPD) is a long-term (chronic) condition that affects the lungs. COPD is a general term that can be used to describe many different lung problems that cause lung swelling (inflammation) and limit airflow, including chronic bronchitis and emphysema. The main symptom of COPD is shortness of breath, which makes it harder to do even simple tasks. This can also make it harder to exercise and be active. Talk with your health care provider about treatments to help you breathe better and actions you can take to prevent breathing problems during physical activity. What are the benefits of exercising with COPD? Exercising regularly is an important part of a healthy lifestyle. You can still exercise and do physical activities even though you have COPD. Exercise and physical activity improve your shortness of breath by increasing blood flow (circulation). This causes your heart to pump more oxygen through your body. Moderate exercise can improve your:  Oxygen use.  Energy level.  Shortness of breath.  Strength in your breathing muscles.  Heart health.  Sleep.  Self-esteem and feelings of self-worth.  Depression, stress, and anxiety levels. Exercise can benefit everyone with COPD. The severity of your disease may affect how hard you can exercise, especially at first, but everyone can benefit. Talk with your health care provider about how much exercise is safe for you, and which activities and exercises are safe for you.   What actions can I take to prevent breathing problems during physical activity?  Sign up for a pulmonary  rehabilitation program. This type of program may include: ? Education about lung diseases. ? Exercise classes that teach you how to exercise and be more active while improving your breathing. This usually involves:  Exercise using your lower extremities, such as a stationary bicycle.  About 30 minutes of exercise, 2 to 5 times per week, for 6 to 12 weeks  Strength training, such as push ups or leg lifts. ? Nutrition education. ? Group classes in which you can talk with others who also have COPD and learn ways to manage stress.  If you use an oxygen tank, you should use it while you exercise. Work with your health care provider to adjust your oxygen for your physical activity. Your resting flow rate is different from your flow rate during physical activity.  While you are exercising: ? Take slow breaths. ? Pace yourself and do not try to go too fast. ? Purse your lips while breathing out. Pursing your lips is similar to a kissing or whistling position. ? If doing exercise that uses a quick burst of effort, such as weight lifting:  Breathe in before starting the exercise.  Breathe out during the hardest part of the exercise (such as raising the weights). Where to find support You can find support for exercising with COPD from:  Your health care provider.  A pulmonary rehabilitation program.  Your local health department or community health programs.  Support groups, online or in-person. Your health care provider may be able to recommend support groups. Where to find more information You can find more information about exercising with COPD from:  American Lung Association: ClassInsider.se.  COPD Foundation: https://www.rivera.net/.  Contact a health care provider if:  Your symptoms get worse.  You have chest pain.  You have nausea.  You have a fever.  You have trouble talking or catching your breath.  You want to start a new exercise program or a new activity. Summary  COPD is a  general term that can be used to describe many different lung problems that cause lung swelling (inflammation) and limit airflow. This includes chronic bronchitis and emphysema.  Exercise and physical activity improve your shortness of breath by increasing blood flow (circulation). This causes your heart to provide more oxygen to your body.  Contact your health care provider before starting any exercise program or new activity. Ask your health care provider what exercises and activities are safe for you. This information is not intended to replace advice given to you by your health care provider. Make sure you discuss any questions you have with your health care provider. Document Revised: 01/02/2019 Document Reviewed: 10/05/2017 Elsevier Patient Education  2021 [employer].

## 2021-01-21 NOTE — Progress Notes (Signed)
Reviewed and agree with assessment/plan.   Leyah Bocchino, MD [facility] 01/21/2021, 12:02 PM Pager:  [phone]  

## 2021-01-21 NOTE — Addendum Note (Signed)
Addended by: Mathis Bud on: 01/21/2021 12:51 PM   Modules accepted: Orders

## 2021-01-27 ENCOUNTER — Telehealth: Payer: Self-pay | Admitting: Primary Care

## 2021-01-27 MED ORDER — ANORO ELLIPTA 62.5-25 MCG/INH IN AEPB: 1.0000 | INHALATION_SPRAY | Freq: Every day | RESPIRATORY_TRACT | 2 refills | Status: DC

## 2021-01-27 NOTE — Telephone Encounter (Signed)
rx for the Anoro has been sent to the pharmacy per pts request.  I have called and LM on VM to make him aware.

## 2021-02-04 ENCOUNTER — Ambulatory Visit: Payer: Medicare Other | Admitting: Primary Care

## 2021-03-25 ENCOUNTER — Encounter (INDEPENDENT_AMBULATORY_CARE_PROVIDER_SITE_OTHER): Payer: Medicare Other | Admitting: Ophthalmology

## 2021-03-25 ENCOUNTER — Other Ambulatory Visit: Payer: Self-pay

## 2021-03-25 DIAGNOSIS — H35033 Hypertensive retinopathy, bilateral: Secondary | ICD-10-CM | POA: Diagnosis not present

## 2021-03-25 DIAGNOSIS — H353122 Nonexudative age-related macular degeneration, left eye, intermediate dry stage: Secondary | ICD-10-CM

## 2021-03-25 DIAGNOSIS — I10 Essential (primary) hypertension: Secondary | ICD-10-CM

## 2021-03-25 DIAGNOSIS — H353211 Exudative age-related macular degeneration, right eye, with active choroidal neovascularization: Secondary | ICD-10-CM | POA: Diagnosis not present

## 2021-03-25 DIAGNOSIS — H43813 Vitreous degeneration, bilateral: Secondary | ICD-10-CM

## 2021-04-11 ENCOUNTER — Other Ambulatory Visit: Payer: Self-pay | Admitting: Primary Care

## 2021-05-06 ENCOUNTER — Encounter: Payer: Self-pay | Admitting: Primary Care

## 2021-05-06 ENCOUNTER — Other Ambulatory Visit: Payer: Self-pay

## 2021-05-06 ENCOUNTER — Ambulatory Visit: Payer: Medicare Other | Admitting: Primary Care

## 2021-05-06 DIAGNOSIS — J9611 Chronic respiratory failure with hypoxia: Secondary | ICD-10-CM

## 2021-05-06 DIAGNOSIS — J449 Chronic obstructive pulmonary disease, unspecified: Secondary | ICD-10-CM | POA: Diagnosis not present

## 2021-05-06 MED ORDER — ANORO ELLIPTA 62.5-25 MCG/INH IN AEPB: INHALATION_SPRAY | RESPIRATORY_TRACT | 11 refills | Status: DC

## 2021-05-06 MED ORDER — ANORO ELLIPTA 62.5-25 MCG/INH IN AEPB: 1.0000 | INHALATION_SPRAY | Freq: Every day | RESPIRATORY_TRACT | 0 refills | Status: DC

## 2021-05-06 NOTE — Patient Instructions (Addendum)
Nice seeing you again Hector Daniels  Recommendations: - Continue Anoro one puff daily in the morning - Use albuterol rescue inhaler 2 puffs every 4-6 hours as needed for breakthrough shortness of breath  - Continue exercise regimen - Call our office if you develop increased shortness of breath, chest tightness/wheezing or purulent cough  Follow-up: - 6 months with Dr. Halford Chessman or sooner if needed    COPD and Physical Activity Chronic obstructive pulmonary disease (COPD) is a long-term, or chronic, condition that affects the lungs. COPD is a general term that can be used to describe many problems that cause inflammation of the lungs and limit airflow.These conditions include chronic bronchitis and emphysema. The main symptom of COPD is shortness of breath, which makes it harder to do even simple tasks. This can also make it harder to exercise and stay active. Talk with your health care provider about treatments to help you breathe betterand actions you can take to prevent breathing problems during physical activity. What are the benefits of exercising when you have COPD? Exercising regularly is an important part of a healthy lifestyle. You can still exercise and do physical activities even though you have COPD. Exercise and physical activity improve your shortness of breath by increasing blood flow (circulation). This causes your heart to pump more oxygen through your body. Moderate exercise can: Improve oxygen use. Increase your energy level. Help with shortness of breath. Strengthen your breathing muscles. Improve heart health. Help with sleep. Improve your self-esteem and feelings of self-worth. Lower depression, stress, and anxiety. Exercise can benefit everyone with COPD. The severity of your disease may affect how hard you can exercise, especially at first, but everyone can benefit. Talk with your health care provider about how much exercise is safefor you, and which activities and exercises  are safe for you. What actions can I take to prevent breathing problems during physical activity? Sign up for a pulmonary rehabilitation program. This type of program may include: Education about lung diseases. Exercise classes that teach you how to exercise and be more active while improving your breathing. This usually involves: Exercise using your lower extremities, such as a stationary bicycle. About 30 minutes of exercise, 2 to 5 times per week, for 6 to 12 weeks. Strength training, such as push-ups or leg lifts. Nutrition education. Group classes in which you can talk with others who also have COPD and learn ways to manage stress. If you use an oxygen tank, you should use it while you exercise. Work with your health care provider to adjust your oxygen for your physical activity. Your resting flow rate is different from your flow rate during physical activity. How to manage your breathing while exercising While you are exercising: Take slow breaths. Pace yourself, and do nottry to go too fast. Purse your lips while breathing out. Pursing your lips is similar to a kissing or whistling position. If doing exercise that uses a quick burst of effort, such as weight lifting: Breathe in before starting the exercise. Breathe out during the hardest part of the exercise, such as raising the weights. Where to find support You can find support for exercising with COPD from: Your health care provider. A pulmonary rehabilitation program. Your local health department or community health programs. Support groups, either online or in-person. Your health care provider may be able to recommend support groups. Where to find more information You can find more information about exercising with COPD from: American Lung Association: lung.org COPD Foundation: copdfoundation.org Contact a  health care provider if: Your symptoms get worse. You have nausea. You have a fever. You want to start a new exercise  program or a new activity. Get help right away if: You have chest pain. You cannot breathe. These symptoms may represent a serious problem that is an emergency. Do not wait to see if the symptoms will go away. Get medical help right away. Call your local emergency services (911 in the [place] Do not drive yourself to the hospital. Summary COPD is a general term that can be used to describe many different lung problems that cause lung inflammation and limit airflow. This includes chronic bronchitis and emphysema. Exercise and physical activity improve your shortness of breath by increasing blood flow (circulation). This causes your heart to provide more oxygen to your body. Contact your health care provider before starting any exercise program or new activity. Ask your health care provider what exercises and activities are safe for you. This information is not intended to replace advice given to you by your health care provider. Make sure you discuss any questions you have with your healthcare provider. Document Revised: 07/21/2020 Document Reviewed: 07/21/2020 Elsevier Patient Education  2022 [employer].

## 2021-05-06 NOTE — Assessment & Plan Note (Addendum)
-   Continue 2L oxygen at bedtime  - Recommend checking ONO at next visit

## 2021-05-06 NOTE — Progress Notes (Signed)
$'@Patient'P$  ID: Hector Daniels., male    DOB: 1946-07-02, 75 y.o.   MRN: [id number]  No chief complaint on file.   Referring provider: Leeanne Rio, MD  HPI: 75 year old male, former smoker quit 1980 (13-pack-year history).  Past medical history significant for centrilobular emphysema, COPD, chronic respiratory failure (O2 dependent), hypertension, hyperlipidemia, coronary artery disease status post DES.  Patient of Dr. Halford Chessman on 06/30/2020.  Maintained on Incruse and as needed albuterol now.  Previous [facility] pulmonary encounter: 01/21/2021 Patient presents today for 76-monthfollow-up. He is doing well, reports that his shortness of breath is some worse with activity. He is trying to stay active. He recently starting going to the gym three days a week with [employer] group. He does his own errands and shopping. He will have dyspnea with strenuous activity such as climbing stairs or mowing lawn. He is compliant with Incruse and feels it works well but is wondering if there is a better inhaler. He is not using his rescue inhaler much, only time is when he is at the gym. CAT score 8. Denies cough, chest tightness, wheezing.   05/06/2021- Interim hx  Patient presents today for 4 month follow-up/ COPD. He is doing well, no acute complaints today. He noticed a significant benefit in his breathing after starting ANORO. States that he does not get out of breath as often. He is able to walk longer distances. Feels more comfortable. He is still going to the gym three days a week. He is not currently using daytime oxygen, continues 2L at night. Denies chest tightness, wheezing or cough.    Pulmonary testing Spirometry 11/25/13 >> FEV1 0.74, FEV1% 35  Ambulatory oximetry RA 01/09/14 >> SpO2 80% after 1 lap A1AT 01/09/14 >> 161, PI-MM   Sleep test ONO with RA 03/03/15 >> test time 6 hrs 48 min.  Baseline SpO2 92%, low SpO2 78%. Spent 52 min with SpO2 < 88%.  Cardiac test Echo 10/08/12 >> mild LVH, EF 40 to 45%     Allergies  Allergen Reactions   Crestor [Rosuvastatin]     Right hip pain and weakness   Tessalon [Benzonatate]     Weird feeling     Immunization History  Administered Date(s) Administered   Fluad Quad(high Dose 65+) 07/16/2020   Influenza Split 07/04/2012, 06/26/2013, 07/07/2017   Influenza, High Dose Seasonal PF 06/26/2016, 06/26/2018   Influenza,inj,Quad PF,6+ Mos 06/13/2014, 06/27/2015   Influenza,inj,quad, With Preservative 06/28/2016   Influenza-Unspecified 08/05/2008, 06/17/2009, 07/01/2011, 07/31/2013, 06/29/2015, 06/29/2018, 06/26/2020   Moderna Sars-Covid-2 Vaccination 10/31/2019, 11/28/2019, 07/29/2020   Pneumococcal Conjugate-13 09/27/2011, 07/15/2015   Pneumococcal Polysaccharide-23 05/04/2011, 04/25/2019   Tdap 07/15/2015   Zoster Recombinat (Shingrix) 01/29/2018, 07/12/2018   Zoster, Live 07/31/2013, 06/26/2016    Past Medical History:  Diagnosis Date   [place]    Episode 12/13, prerenal, ACE inhibitor and diuretic discontinued   COPD (chronic obstructive pulmonary disease) (HCC)    Coronary atherosclerosis of native coronary artery    Moderate multivessel, s/p DES x 2 RCA 2006   Essential hypertension, benign    History of hematuria    Mixed hyperlipidemia    Tubular adenoma     Tobacco History: Social History   Tobacco Use  Smoking Status Former   Packs/day: 1.00   Years: 13.00   Pack years: 13.00   Types: Cigarettes   Start date: 09/26/1965   Quit date: 09/26/1978   Years since quitting: 42.6  Smokeless Tobacco Never  Tobacco Comments  Quit x 20 years   Counseling given: Not Answered Tobacco comments: Quit x 20 years   Outpatient Medications Prior to Visit  Medication Sig Dispense Refill   albuterol (PROAIR HFA) 108 (90 Base) MCG/ACT inhaler INHALE TWO PUFFS BY MOUTH EVERY 6 HOURS AS NEEDED FOR FOR WHEEZING AND SHORTNESS OF BREATH 8.5 g 2   amLODipine (NORVASC) 5 MG tablet Take 5 mg by mouth daily.     aspirin 325  MG tablet Take 325 mg by mouth daily.     aspirin 81 MG EC tablet Take by mouth.     aspirin EC 81 MG tablet Take 81 mg by mouth daily. Swallow whole.     atorvastatin (LIPITOR) 40 MG tablet Take 40 mg by mouth daily.  2   bevacizumab (AVASTIN) 1.25 mg/0.1 mL SOLN 1.25 mg by Intravitreal route every 30 (thirty) days.      cetirizine (ZYRTEC) 10 MG tablet Take 10 mg by mouth daily.     Cholecalciferol (VITAMIN D3) 2000 UNITS capsule Take 2,000 Units by mouth daily.     clopidogrel (PLAVIX) 75 MG tablet Take 75 mg by mouth daily.     famotidine (PEPCID) 10 MG tablet Take 10 mg by mouth 2 (two) times daily.     hydrALAZINE (APRESOLINE) 50 MG tablet Take 50 mg by mouth 3 (three) times daily.      metoprolol (LOPRESSOR) 50 MG tablet Take 75 mg by mouth 2 (two) times daily.     Multiple Vitamin (MULTIVITAMIN) capsule Take 1 capsule by mouth daily.     Multiple Vitamins-Minerals (PRESERVISION AREDS 2 PO) Take 1 tablet by mouth 2 (two) times daily.      OXYGEN Inhale 2 L into the lungs at bedtime.      trimethoprim-polymyxin b (POLYTRIM) ophthalmic solution Place 1 drop into the right eye 4 (four) times daily.      INCRUSE ELLIPTA 62.5 MCG/INH AEPB INHALE ONE PUFF INTO THE LUNGS DAILY 30 each 0   umeclidinium-vilanterol (ANORO ELLIPTA) 62.5-25 MCG/INH AEPB Inhale 1 puff into the lungs daily. 14 each 0   umeclidinium-vilanterol (ANORO ELLIPTA) 62.5-25 MCG/INH AEPB INHALE ONE PUFF into THE lungs DAILY 60 each 0   No facility-administered medications prior to visit.    Review of Systems  Review of Systems  Constitutional: Negative.   HENT: Negative.    Respiratory:  Negative for cough, shortness of breath and wheezing.     Physical Exam  BP 140/60 (BP Location: Left Arm, Patient Position: Sitting, Cuff Size: Normal)   Pulse 61   Temp 97.7 F (36.5 C) (Oral)   Ht '5\' 9"'$  (1.753 m)   Wt 236 lb 9.6 oz (107.3 kg)   SpO2 96%   BMI 34.94 kg/m  Physical Exam Constitutional:      General: He is  not in acute distress.    Appearance: Normal appearance. He is obese. He is not ill-appearing.  HENT:     Mouth/Throat:     Mouth: Mucous membranes are moist.     Pharynx: Oropharynx is clear.  Cardiovascular:     Rate and Rhythm: Normal rate and regular rhythm.  Pulmonary:     Effort: Pulmonary effort is normal.     Breath sounds: Normal breath sounds. No wheezing, rhonchi or rales.  Musculoskeletal:        General: Normal range of motion.  Skin:    General: Skin is warm and dry.  Neurological:     General: No focal deficit present.  Mental Status: He is alert and oriented to person, place, and time. Mental status is at baseline.  Psychiatric:        Mood and Affect: Mood normal.        Behavior: Behavior normal.        Thought Content: Thought content normal.        Judgment: Judgment normal.     Lab Results:  CBC    Component Value Date/Time   WBC 10.2 01/09/2014 1601   RBC 4.98 01/09/2014 1601   HGB 14.6 01/09/2014 1601   HCT 43.5 01/09/2014 1601   PLT 198.0 01/09/2014 1601   MCV 87.3 01/09/2014 1601   MCHC 33.7 01/09/2014 1601   RDW 13.3 01/09/2014 1601    BMET    Component Value Date/Time   NA 139 01/09/2014 1601   K 4.6 01/09/2014 1601   CL 98 01/09/2014 1601   CO2 34 (H) 01/09/2014 1601   GLUCOSE 101 (H) 01/09/2014 1601   BUN 15 01/09/2014 1601   CREATININE 0.9 01/09/2014 1601   CALCIUM 9.1 01/09/2014 1601    BNP No results found for: BNP  ProBNP No results found for: PROBNP  Imaging: No results found.   Assessment & Plan:   COPD with chronic bronchitis - Improved; Patient noticed a significant improvement in his breathing when changed from [facility] to [facility]. No acute respiratory complaints or recent exacerbations. Continue Anoro 1 puff daily and prn albuterol hfa 2 puffs q6 hours. Continue exercise regimen. Follow-up in 6 months or sooner if needed.   Chronic respiratory failure with hypoxia - Continue 2L oxygen at bedtime  - Recommend  checking ONO at next visit      Martyn Ehrich, NP 05/06/2021

## 2021-05-06 NOTE — Addendum Note (Signed)
Addended by: Dessie Coma on: 05/06/2021 10:07 AM   Modules accepted: Orders

## 2021-05-06 NOTE — Assessment & Plan Note (Addendum)
-   Improved; Patient noticed a significant improvement in his breathing when changed from [facility] to [facility]. No acute respiratory complaints or recent exacerbations. Continue Anoro 1 puff daily and prn albuterol hfa 2 puffs q6 hours. Continue exercise regimen. Follow-up in 6 months or sooner if needed.

## 2021-05-06 NOTE — Progress Notes (Signed)
Reviewed and agree with assessment/plan.   Chesley Mires, MD [facility] Pulmonary/Critical Care 05/06/2021, 10:49 AM Pager:  [phone]

## 2021-06-11 ENCOUNTER — Other Ambulatory Visit: Payer: Self-pay | Admitting: Pulmonary Disease

## 2021-08-12 ENCOUNTER — Other Ambulatory Visit: Payer: Self-pay

## 2021-08-12 ENCOUNTER — Encounter (INDEPENDENT_AMBULATORY_CARE_PROVIDER_SITE_OTHER): Payer: Medicare Other | Admitting: Ophthalmology

## 2021-08-12 DIAGNOSIS — H353211 Exudative age-related macular degeneration, right eye, with active choroidal neovascularization: Secondary | ICD-10-CM | POA: Diagnosis not present

## 2021-08-12 DIAGNOSIS — H353122 Nonexudative age-related macular degeneration, left eye, intermediate dry stage: Secondary | ICD-10-CM | POA: Diagnosis not present

## 2021-08-12 DIAGNOSIS — H35033 Hypertensive retinopathy, bilateral: Secondary | ICD-10-CM

## 2021-08-12 DIAGNOSIS — I10 Essential (primary) hypertension: Secondary | ICD-10-CM

## 2021-08-12 DIAGNOSIS — H43813 Vitreous degeneration, bilateral: Secondary | ICD-10-CM

## 2021-10-28 ENCOUNTER — Ambulatory Visit: Payer: Medicare Other | Admitting: Primary Care

## 2021-10-28 ENCOUNTER — Encounter: Payer: Self-pay | Admitting: Primary Care

## 2021-10-28 ENCOUNTER — Other Ambulatory Visit: Payer: Self-pay

## 2021-10-28 VITALS — BP 134/72 | HR 57 | Temp 98.4°F | Ht 68.0 in | Wt 233.6 lb

## 2021-10-28 DIAGNOSIS — J9611 Chronic respiratory failure with hypoxia: Secondary | ICD-10-CM

## 2021-10-28 DIAGNOSIS — J310 Chronic rhinitis: Secondary | ICD-10-CM | POA: Diagnosis not present

## 2021-10-28 DIAGNOSIS — J449 Chronic obstructive pulmonary disease, unspecified: Secondary | ICD-10-CM

## 2021-10-28 MED ORDER — UMECLIDINIUM-VILANTEROL 62.5-25 MCG/ACT IN AEPB: INHALATION_SPRAY | RESPIRATORY_TRACT | 0 refills | Status: DC

## 2021-10-28 NOTE — Assessment & Plan Note (Addendum)
-   Stable; No daytime requirements. Continue 2L oxygen at night, checking ONO on RA

## 2021-10-28 NOTE — Assessment & Plan Note (Signed)
-   Stable interval; Breathing improved on ANORO. Rare SABA use. No recent exacerbations. CAT 15. No changed today. Follow-up in 6 months or sooner if needed.

## 2021-10-28 NOTE — Progress Notes (Signed)
Reviewed and agree with assessment/plan.   Chesley Mires, MD [facility] Pulmonary/Critical Care 10/28/2021, 10:51 PM Pager:  [phone]

## 2021-10-28 NOTE — Assessment & Plan Note (Signed)
-   Advised patient use flonase nasal spray as needed for PND

## 2021-10-28 NOTE — Progress Notes (Signed)
@Patient  ID: Hector Daniels., male    DOB: 1946/09/26, 76 y.o.   MRN: [id number]  Chief Complaint  Patient presents with   Follow-up    Sob-better, Anoro working good    Referring provider: Leeanne Rio, MD  HPI: 76 year old male, former smoker quit 1980 (13-pack-year history).  Past medical history significant for centrilobular emphysema, COPD, chronic respiratory failure (O2 dependent), hypertension, hyperlipidemia, coronary artery disease status post DES.  Patient of Dr. Halford Chessman. Incruse changed to Anoro in April 2022.  Previous [facility] pulmonary encounter: 01/21/2021 Patient presents today for 43-month follow-up. He is doing well, reports that his shortness of breath is some worse with activity. He is trying to stay active. He recently starting going to the gym three days a week with [employer] group. He does his own errands and shopping. He will have dyspnea with strenuous activity such as climbing stairs or mowing lawn. He is compliant with Incruse and feels it works well but is wondering if there is a better inhaler. He is not using his rescue inhaler much, only time is when he is at the gym. CAT score 8. Denies cough, chest tightness, wheezing.   05/06/2021 Patient presents today for 4 month follow-up/ COPD. He is doing well, no acute complaints today. He noticed a significant benefit in his breathing after starting ANORO. States that he does not get out of breath as often. He is able to walk longer distances. Feels more comfortable. He is still going to the gym three days a week. He is not currently using daytime oxygen, continues 2L at night. Denies chest tightness, wheezing or cough.   10/28/2021- Interim hx  Patient presents today for 6 month follow-up. He is doing well today, no acute symptoms. He has noticed improvement in his breathing and endurance since starting Anoro back in April 2022. He rarely requires his Albuterol rescue inhaler. He occasionally gets mucus to the back of his  throat. He has some post nasal drip in the morning. He continues to wear 2L oxygen at night. Denies f/c/s, shortness of breath, chest tightness, wheezing.   Pulmonary testing Spirometry 11/25/13 >> FEV1 0.74, FEV1% 35  Ambulatory oximetry RA 01/09/14 >> SpO2 80% after 1 lap A1AT 01/09/14 >> 161, PI-MM   Sleep test ONO with RA 03/03/15 >> test time 6 hrs 48 min.  Baseline SpO2 92%, low SpO2 78%. Spent 52 min with SpO2 < 88%.   Cardiac test Echo 10/08/12 >> mild LVH, EF 40 to 45%   Allergies  Allergen Reactions   Crestor [Rosuvastatin]     Right hip pain and weakness   Tessalon [Benzonatate]     Weird feeling     Immunization History  Administered Date(s) Administered   Fluad Quad(high Dose 65+) 07/16/2020, 06/10/2021   Influenza Split 07/04/2012, 06/26/2013, 07/07/2017   Influenza, High Dose Seasonal PF 06/26/2016, 06/26/2018   Influenza,inj,Quad PF,6+ Mos 06/13/2014, 06/27/2015   Influenza,inj,quad, With Preservative 06/28/2016   Influenza-Unspecified 08/05/2008, 06/17/2009, 07/01/2011, 07/31/2013, 06/29/2015, 06/29/2018, 06/26/2020   Moderna Sars-Covid-2 Vaccination 10/31/2019, 11/28/2019, 07/29/2020   Pneumococcal Conjugate-13 09/27/2011, 07/15/2015   Pneumococcal Polysaccharide-23 05/04/2011, 04/25/2019   Tdap 07/15/2015   Zoster Recombinat (Shingrix) 01/29/2018, 07/12/2018   Zoster, Live 07/31/2013, 06/26/2016    Past Medical History:  Diagnosis Date   Acute renal failure ([place])    Episode 12/13, prerenal, ACE inhibitor and diuretic discontinued   COPD (chronic obstructive pulmonary disease) (HCC)    Coronary atherosclerosis of native coronary artery    Moderate multivessel,  s/p DES x 2 RCA 2006   Essential hypertension, benign    History of hematuria    Mixed hyperlipidemia    Tubular adenoma     Tobacco History: Social History   Tobacco Use  Smoking Status Former   Packs/day: 1.00   Years: 13.00   Pack years: 13.00   Types: Cigarettes   Start date:  09/26/1965   Quit date: 09/26/1978   Years since quitting: 43.1  Smokeless Tobacco Never  Tobacco Comments   Quit x 20 years   Counseling given: Not Answered Tobacco comments: Quit x 20 years   Outpatient Medications Prior to Visit  Medication Sig Dispense Refill   albuterol (VENTOLIN HFA) 108 (90 Base) MCG/ACT inhaler inhale TWO puffs EVERY 6 HOURS AS NEEDED FOR wheezing OR SHORTNESS OF BREATH 8.5 g 2   amLODipine (NORVASC) 5 MG tablet Take 5 mg by mouth daily.     aspirin EC 81 MG tablet Take 81 mg by mouth daily. Swallow whole.     atorvastatin (LIPITOR) 40 MG tablet Take 40 mg by mouth daily.  2   bevacizumab (AVASTIN) 1.25 mg/0.1 mL SOLN 1.25 mg by Intravitreal route every 30 (thirty) days.      cetirizine (ZYRTEC) 10 MG tablet Take 10 mg by mouth daily.     Cholecalciferol (VITAMIN D3) 2000 UNITS capsule Take 2,000 Units by mouth daily.     clopidogrel (PLAVIX) 75 MG tablet Take 75 mg by mouth daily.     famotidine (PEPCID) 10 MG tablet Take 10 mg by mouth 2 (two) times daily.     hydrALAZINE (APRESOLINE) 50 MG tablet Take 50 mg by mouth 3 (three) times daily.      metoprolol (LOPRESSOR) 50 MG tablet Take 75 mg by mouth 2 (two) times daily.     Multiple Vitamin (MULTIVITAMIN) capsule Take 1 capsule by mouth daily.     Multiple Vitamins-Minerals (PRESERVISION AREDS 2 PO) Take 1 tablet by mouth 2 (two) times daily.      OXYGEN Inhale 2 L into the lungs at bedtime.      trimethoprim-polymyxin b (POLYTRIM) ophthalmic solution Place 1 drop into the right eye 4 (four) times daily.      umeclidinium-vilanterol (ANORO ELLIPTA) 62.5-25 MCG/INH AEPB INHALE ONE PUFF into THE lungs DAILY 60 each 11   aspirin 325 MG tablet Take 325 mg by mouth daily. (Patient not taking: Reported on 10/28/2021)     aspirin 81 MG EC tablet Take by mouth. (Patient not taking: Reported on 10/28/2021)     umeclidinium-vilanterol (ANORO ELLIPTA) 62.5-25 MCG/INH AEPB Inhale 1 puff into the lungs daily. 60 each 0   No  facility-administered medications prior to visit.    Review of Systems  Review of Systems  Constitutional: Negative.   HENT:  Positive for postnasal drip.   Respiratory:  Negative for cough, chest tightness, shortness of breath and wheezing.     Physical Exam  BP 134/72 (BP Location: Left Arm, Cuff Size: Normal)    Pulse (!) 57    Temp 98.4 F (36.9 C) (Temporal)    Ht 5\' 8"  (1.727 m)    Wt 233 lb 9.6 oz (106 kg)    SpO2 97%    BMI 35.52 kg/m  Physical Exam Constitutional:      General: He is not in acute distress.    Appearance: Normal appearance. He is obese. He is not ill-appearing.  HENT:     Head: Normocephalic and atraumatic.     Mouth/Throat:  Mouth: Mucous membranes are moist.     Pharynx: Oropharynx is clear.  Cardiovascular:     Rate and Rhythm: Normal rate and regular rhythm.  Pulmonary:     Effort: Pulmonary effort is normal.     Breath sounds: Normal breath sounds.  Musculoskeletal:        General: Normal range of motion.  Skin:    General: Skin is warm and dry.  Neurological:     General: No focal deficit present.     Mental Status: He is alert and oriented to person, place, and time. Mental status is at baseline.  Psychiatric:        Mood and Affect: Mood normal.        Behavior: Behavior normal.        Thought Content: Thought content normal.        Judgment: Judgment normal.     Lab Results:  CBC    Component Value Date/Time   WBC 10.2 01/09/2014 1601   RBC 4.98 01/09/2014 1601   HGB 14.6 01/09/2014 1601   HCT 43.5 01/09/2014 1601   PLT 198.0 01/09/2014 1601   MCV 87.3 01/09/2014 1601   MCHC 33.7 01/09/2014 1601   RDW 13.3 01/09/2014 1601    BMET    Component Value Date/Time   NA 139 01/09/2014 1601   K 4.6 01/09/2014 1601   CL 98 01/09/2014 1601   CO2 34 (H) 01/09/2014 1601   GLUCOSE 101 (H) 01/09/2014 1601   BUN 15 01/09/2014 1601   CREATININE 0.9 01/09/2014 1601   CALCIUM 9.1 01/09/2014 1601    BNP No results found for:  BNP  ProBNP No results found for: PROBNP  Imaging: No results found.   Assessment & Plan:   COPD with chronic bronchitis - Stable interval; Breathing improved on ANORO. Rare SABA use. No recent exacerbations. CAT 15. No changed today. Follow-up in 6 months or sooner if needed.   Chronic respiratory failure with hypoxia - Stable; No daytime requirements. Continue 2L oxygen at night, checking ONO on RA   Rhinitis - Advised patient use flonase nasal spray as needed for PND    Martyn Ehrich, NP 10/28/2021

## 2021-10-28 NOTE — Patient Instructions (Addendum)
Recommendations: Continue Anoro one puff daily in the morning Use Albuterol 2 puffs every 6 hours only as needed for breakthrough shortness of breath Try using flonase OTC for post nasal drip symptoms/mucus  Orders: ONO on room air re: nocturnal hypoxia  Follow-up: 6 months or sooner if needed

## 2021-10-28 NOTE — Addendum Note (Signed)
Addended by: Mathis Bud on: 10/28/2021 09:47 AM   Modules accepted: Orders

## 2021-11-17 ENCOUNTER — Telehealth: Payer: Self-pay | Admitting: Primary Care

## 2021-11-17 NOTE — Telephone Encounter (Signed)
Ono on RA 11/02/21 showed patient 23 mins with Spo2 <88%. Lowest O2 74%. Needs to continue to wear 2L oxygen at night.

## 2021-11-17 NOTE — Telephone Encounter (Signed)
Lm for patient.   Hector Daniels, have you received ONO?

## 2021-11-17 NOTE — Telephone Encounter (Signed)
Lm for patient.  

## 2021-12-30 ENCOUNTER — Encounter (INDEPENDENT_AMBULATORY_CARE_PROVIDER_SITE_OTHER): Payer: Medicare Other | Admitting: Ophthalmology

## 2021-12-30 DIAGNOSIS — H35033 Hypertensive retinopathy, bilateral: Secondary | ICD-10-CM | POA: Diagnosis not present

## 2021-12-30 DIAGNOSIS — H353211 Exudative age-related macular degeneration, right eye, with active choroidal neovascularization: Secondary | ICD-10-CM

## 2021-12-30 DIAGNOSIS — H353122 Nonexudative age-related macular degeneration, left eye, intermediate dry stage: Secondary | ICD-10-CM | POA: Diagnosis not present

## 2021-12-30 DIAGNOSIS — I10 Essential (primary) hypertension: Secondary | ICD-10-CM

## 2021-12-30 DIAGNOSIS — H43813 Vitreous degeneration, bilateral: Secondary | ICD-10-CM | POA: Diagnosis not present

## 2022-01-04 ENCOUNTER — Encounter: Payer: Self-pay | Admitting: *Deleted

## 2022-01-04 NOTE — Telephone Encounter (Signed)
Attempted to call pt but unable to reach. Left message for him to return call. Due to multiple attempts trying to reach pt, letter will be sent to pt and encounter will be closed. ?

## 2022-01-10 ENCOUNTER — Telehealth: Payer: Self-pay | Admitting: Primary Care

## 2022-01-10 NOTE — Telephone Encounter (Signed)
I called the patient and he voices understanding. Nothing further needed.  °

## 2022-04-26 ENCOUNTER — Ambulatory Visit (INDEPENDENT_AMBULATORY_CARE_PROVIDER_SITE_OTHER): Payer: Medicare Other

## 2022-04-26 ENCOUNTER — Ambulatory Visit: Payer: Medicare Other | Admitting: Primary Care

## 2022-04-26 ENCOUNTER — Encounter: Payer: Self-pay | Admitting: Primary Care

## 2022-04-26 VITALS — BP 142/70 | HR 66 | Temp 97.9°F | Ht 69.0 in | Wt 235.2 lb

## 2022-04-26 DIAGNOSIS — G4734 Idiopathic sleep related nonobstructive alveolar hypoventilation: Secondary | ICD-10-CM

## 2022-04-26 DIAGNOSIS — J449 Chronic obstructive pulmonary disease, unspecified: Secondary | ICD-10-CM | POA: Diagnosis not present

## 2022-04-26 MED ORDER — TRELEGY ELLIPTA 100-62.5-25 MCG/ACT IN AEPB: 28.0000 | INHALATION_SPRAY | Freq: Once | RESPIRATORY_TRACT | 0 refills | Status: AC

## 2022-04-26 NOTE — Progress Notes (Signed)
Please let patient know CXR showed no active cardiopulmonary disease. Lungs clear.

## 2022-04-26 NOTE — Assessment & Plan Note (Signed)
-   Patient has severe COPD. Breathing remains baseline. No recent exacerbations or hospitalizations. He has chronic dyspnea symptoms and np cough. CAT score 17. He needs updated chest imaging and spirometry today. Lung function remains stable compared to 2015/ FEV1 0.9 (30%). Recommend trial Trelegy 185mg one puff daily for better control of dyspnea symptoms. He will call office to notify [place] he does with new inhaler. FU in 6 months or sooner if needed.

## 2022-04-26 NOTE — Assessment & Plan Note (Signed)
-   ONO in 2016 showed baseline SpO2 92%, low SpO2 78%. Spent 52 min with SpO2 < 88%. - Continues to benefit from 2L supplemental oxygen at night. No daytime requirements.

## 2022-04-26 NOTE — Patient Instructions (Addendum)
You look well today Hector Daniels, I would like to get updated chest imagine and breathing test since these are greater than 76 years old.  Recommend we try you on a new inhaler called Trelegy, this will replace Anoro.  Please notify us if you notice any improvement in respiratory symptoms and we can send a prescription in for new inhaler.  Your lung function remains stable compared to 2015   Recommendations: - Start Trelegy sample for 2 weeks, take 1 puff daily in the morning (hold Anoro while using ) - Use albuterol rescue inhaler 2 puffs every 6 hours as needed for breakthrough shortness of breath or wheezing - Notify us  if you develop any change in respiratory symptoms, increase shortness of breath or purulent mucus - Stay as active as possible  Orders: - CXR today (ordered) - Spirometry  Follow-up: - 6 months with Dr. Halford Chessman or sooner if needed    COPD and Physical Activity Chronic obstructive pulmonary disease (COPD) is a long-term, or chronic, condition that affects the lungs. COPD is a general term that can be used to describe many problems that cause inflammation of the lungs and limit airflow. These conditions include chronic bronchitis and emphysema. The main symptom of COPD is shortness of breath, which makes it harder to do even simple tasks. This can also make it harder to exercise and stay active. Talk with your health care provider about treatments to help you breathe better and actions you can take to prevent breathing problems during physical activity. What are the benefits of exercising when you have COPD? Exercising regularly is an important part of a healthy lifestyle. You can still exercise and do physical activities even though you have COPD. Exercise and physical activity improve your shortness of breath by increasing blood flow (circulation). This causes your heart to pump more oxygen through your body. Moderate exercise can: Improve oxygen use. Increase your energy  level. Help with shortness of breath. Strengthen your breathing muscles. Improve heart health. Help with sleep. Improve your self-esteem and feelings of self-worth. Lower depression, stress, and anxiety. Exercise can benefit everyone with COPD. The severity of your disease may affect how hard you can exercise, especially at first, but everyone can benefit. Talk with your health care provider about how much exercise is safe for you, and which activities and exercises are safe for you. What actions can I take to prevent breathing problems during physical activity? Sign up for a pulmonary rehabilitation program. This type of program may include: Education about lung diseases. Exercise classes that teach you how to exercise and be more active while improving your breathing. This usually involves: Exercise using your lower extremities, such as a stationary bicycle. About 30 minutes of exercise, 2 to 5 times per week, for 6 to 12 weeks. Strength training, such as push-ups or leg lifts. Nutrition education. Group classes in which you can talk with others who also have COPD and learn ways to manage stress. If you use an oxygen tank, you should use it while you exercise. Work with your health care provider to adjust your oxygen for your physical activity. Your resting flow rate is different from your flow rate during physical activity. How to manage your breathing while exercising While you are exercising: Take slow breaths. Pace yourself, and do nottry to go too fast. Purse your lips while breathing out. Pursing your lips is similar to a kissing or whistling position. If doing exercise that uses a quick burst of effort, such  as weight lifting: Breathe in before starting the exercise. Breathe out during the hardest part of the exercise, such as raising the weights. Where to find support You can find support for exercising with COPD from: Your health care provider. A pulmonary rehabilitation  program. Your local health department or community health programs. Support groups, either online or in-person. Your health care provider may be able to recommend support groups. Where to find more information You can find more information about exercising with COPD from: American Lung Association: lung.org COPD Foundation: copdfoundation.org Contact a health care provider if: Your symptoms get worse. You have nausea. You have a fever. You want to start a new exercise program or a new activity. Get help right away if: You have chest pain. You cannot breathe. These symptoms may represent a serious problem that is an emergency. Do not wait to see if the symptoms will go away. Get medical help right away. Call your local emergency services (911 in the [place] Do not drive yourself to the hospital. Summary COPD is a general term that can be used to describe many different lung problems that cause lung inflammation and limit airflow. This includes chronic bronchitis and emphysema. Exercise and physical activity improve your shortness of breath by increasing blood flow (circulation). This causes your heart to provide more oxygen to your body. Contact your health care provider before starting any exercise program or new activity. Ask your health care provider what exercises and activities are safe for you. This information is not intended to replace advice given to you by your health care provider. Make sure you discuss any questions you have with your health care provider. Document Revised: 07/21/2020 Document Reviewed: 07/21/2020 Elsevier Patient Education  Hector Daniels.

## 2022-04-26 NOTE — Progress Notes (Signed)
$'@Patient'R$  ID: Hector Daniels., male    DOB: 10/18/1945, 76 y.o.   MRN: [id number]  Chief Complaint  Patient presents with   Follow-up    No new issues since LOV.    Referring provider: Leeanne Rio, MD  HPI: 76 year old male, former smoker quit 1980 (13-pack-year history).  Past medical history significant for centrilobular emphysema, COPD, chronic respiratory failure (O2 dependent), hypertension, hyperlipidemia, coronary artery disease status post DES.  Patient of Dr. Halford Chessman. Incruse changed to Anoro in April 2022.  Previous [facility] pulmonary encounter: 01/21/2021 Patient presents today for 51-monthfollow-up. He is doing well, reports that his shortness of breath is some worse with activity. He is trying to stay active. He recently starting going to the gym three days a week with serior group. He does his own errands and shopping. He will have dyspnea with strenuous activity such as climbing stairs or mowing lawn. He is compliant with Incruse and feels it works well but is wondering if there is a better inhaler. He is not using his rescue inhaler much, only time is when he is at the gym. CAT score 8. Denies cough, chest tightness, wheezing.   05/06/2021 Patient presents today for 4 month follow-up/ COPD. He is doing well, no acute complaints today. He noticed a significant benefit in his breathing after starting ANORO. States that he does not get out of breath as often. He is able to walk longer distances. Feels more comfortable. He is still going to the gym three days a week. He is not currently using daytime oxygen, continues 2L at night. Denies chest tightness, wheezing or cough.   10/28/2021 Patient presents today for 6 month follow-up. He is doing well today, no acute symptoms. He has noticed improvement in his breathing and endurance since starting Anoro back in April 2022. He rarely requires his Albuterol rescue inhaler. He occasionally gets mucus to the back of his throat. He has some post  nasal drip in the morning. He continues to wear 2L oxygen at night. Denies f/c/s, shortness of breath, chest tightness, wheezing.   04/26/2022- Interim hx  Patient presents for 6 month follow-up.  He is doing well today.  Breathing is baseline.  No recent exacerbations or hospitalizations.  He states that he worsens dyspnea symptoms.  He gets winded bending over walking up stairs.  He often needs to clear his throat.  Cough is mainly nonproductive. He is complaint with Anoro. He wears 2L oxygen at bedtime. No daytime requirements. Last CXR and PFTs were 5 year ago.    Pulmonary testing Spirometry 11/25/13 >> FEV1 0.74, FEV1% 35  Ambulatory oximetry RA 01/09/14 >> SpO2 80% after 1 lap A1AT 01/09/14 >> 161, PI-MM Spirometry 04/26/2022 >> FEV1 0.9 (30%)   Sleep test ONO with RA 03/03/15 >> test time 6 hrs 48 min.  Baseline SpO2 92%, low SpO2 78%. Spent 52 min with SpO2 < 88%.   Cardiac test Echo 10/08/12 >> mild LVH, EF 40 to 45%   Allergies  Allergen Reactions   Crestor [Rosuvastatin]     Right hip pain and weakness   Tessalon [Benzonatate]     Weird feeling     Immunization History  Administered Date(s) Administered   Fluad Quad(high Dose 65+) 07/16/2020, 06/10/2021   Influenza Split 07/04/2012, 06/26/2013, 07/07/2017   Influenza, High Dose Seasonal PF 06/26/2016, 06/26/2018   Influenza,inj,Quad PF,6+ Mos 06/13/2014, 06/27/2015   Influenza,inj,quad, With Preservative 06/28/2016   Influenza-Unspecified 08/05/2008, 06/17/2009, 07/01/2011, 07/31/2013, 06/29/2015, 06/29/2018, 06/26/2020  Moderna Sars-Covid-2 Vaccination 10/31/2019, 11/28/2019, 07/29/2020   Pneumococcal Conjugate-13 09/27/2011, 07/15/2015   Pneumococcal Polysaccharide-23 05/04/2011, 04/25/2019   Tdap 07/15/2015   Zoster Recombinat (Shingrix) 01/29/2018, 07/12/2018   Zoster, Live 07/31/2013, 06/26/2016    Past Medical History:  Diagnosis Date   Acute renal failure ([place])    Episode 12/13, prerenal, ACE inhibitor and  diuretic discontinued   COPD (chronic obstructive pulmonary disease) ([facility])    Coronary atherosclerosis of native coronary artery    Moderate multivessel, s/p DES x 2 RCA 2006   Essential hypertension, benign    History of hematuria    Mixed hyperlipidemia    Tubular adenoma     Tobacco History: Social History   Tobacco Use  Smoking Status Former   Packs/day: 1.00   Years: 13.00   Total pack years: 13.00   Types: Cigarettes   Start date: 09/26/1965   Quit date: 09/26/1978   Years since quitting: 43.6  Smokeless Tobacco Never  Tobacco Comments   Quit x 20 years   Counseling given: Not Answered Tobacco comments: Quit x 20 years   Outpatient Medications Prior to Visit  Medication Sig Dispense Refill   albuterol (VENTOLIN HFA) 108 (90 Base) MCG/ACT inhaler inhale TWO puffs EVERY 6 HOURS AS NEEDED FOR wheezing OR SHORTNESS OF BREATH 8.5 g 2   amLODipine (NORVASC) 5 MG tablet Take 5 mg by mouth daily.     aspirin EC 81 MG tablet Take 81 mg by mouth daily. Swallow whole.     atorvastatin (LIPITOR) 40 MG tablet Take 40 mg by mouth daily.  2   bevacizumab (AVASTIN) 1.25 mg/0.1 mL SOLN 1.25 mg by Intravitreal route every 30 (thirty) days.      cetirizine (ZYRTEC) 10 MG tablet Take 10 mg by mouth daily.     Cholecalciferol (VITAMIN D3) 2000 UNITS capsule Take 2,000 Units by mouth daily.     clopidogrel (PLAVIX) 75 MG tablet Take 75 mg by mouth daily.     famotidine (PEPCID) 10 MG tablet Take 10 mg by mouth 2 (two) times daily.     hydrALAZINE (APRESOLINE) 50 MG tablet Take 50 mg by mouth 3 (three) times daily.      metoprolol (LOPRESSOR) 50 MG tablet Take 75 mg by mouth 2 (two) times daily.     Multiple Vitamin (MULTIVITAMIN) capsule Take 1 capsule by mouth daily.     Multiple Vitamins-Minerals (PRESERVISION AREDS 2 PO) Take 1 tablet by mouth 2 (two) times daily.      OXYGEN Inhale 2 L into the lungs at bedtime.      trimethoprim-polymyxin b (POLYTRIM) ophthalmic solution Place 1 drop  into the right eye 4 (four) times daily.      umeclidinium-vilanterol (ANORO ELLIPTA) 62.5-25 MCG/ACT AEPB INHALE ONE PUFF into THE lungs DAILY 14 each 0   No facility-administered medications prior to visit.   Review of Systems  Review of Systems  Constitutional: Negative.   HENT: Negative.    Respiratory:  Positive for cough and shortness of breath.   Cardiovascular: Negative.     Physical Exam  BP (!) 142/70 (BP Location: Left Arm, Patient Position: Sitting, Cuff Size: Large)   Pulse 66   Temp 97.9 F (36.6 C) (Oral)   Ht '5\' 9"'$  (1.753 m)   Wt 235 lb 3.2 oz (106.7 kg)   SpO2 96%   BMI 34.73 kg/m  Physical Exam Constitutional:      General: He is not in acute distress.    Appearance: Normal appearance.  He is not ill-appearing.  HENT:     Head: Normocephalic and atraumatic.     Mouth/Throat:     Mouth: Mucous membranes are moist.     Pharynx: Oropharynx is clear.  Cardiovascular:     Rate and Rhythm: Normal rate and regular rhythm.  Pulmonary:     Effort: Pulmonary effort is normal.     Breath sounds: Wheezing present.  Musculoskeletal:        General: Normal range of motion.  Skin:    General: Skin is warm and dry.  Neurological:     General: No focal deficit present.     Mental Status: He is alert and oriented to person, place, and time. Mental status is at baseline.  Psychiatric:        Mood and Affect: Mood normal.        Behavior: Behavior normal.        Thought Content: Thought content normal.        Judgment: Judgment normal.      Lab Results:  CBC    Component Value Date/Time   WBC 10.2 01/09/2014 1601   RBC 4.98 01/09/2014 1601   HGB 14.6 01/09/2014 1601   HCT 43.5 01/09/2014 1601   PLT 198.0 01/09/2014 1601   MCV 87.3 01/09/2014 1601   MCHC 33.7 01/09/2014 1601   RDW 13.3 01/09/2014 1601    BMET    Component Value Date/Time   NA 139 01/09/2014 1601   K 4.6 01/09/2014 1601   CL 98 01/09/2014 1601   CO2 34 (H) 01/09/2014 1601    GLUCOSE 101 (H) 01/09/2014 1601   BUN 15 01/09/2014 1601   CREATININE 0.9 01/09/2014 1601   CALCIUM 9.1 01/09/2014 1601    BNP No results found for: "BNP"  ProBNP No results found for: "PROBNP"  Imaging: No results found.   Assessment & Plan:   COPD with chronic bronchitis - Patient has severe COPD. Breathing remains baseline. No recent exacerbations or hospitalizations. He has chronic dyspnea symptoms and np cough. CAT score 17. He needs updated chest imaging and spirometry today. Lung function remains stable compared to 2015/ FEV1 0.9 (30%). Recommend trial Trelegy 180mg one puff daily for better control of dyspnea symptoms. He will call office to notify [place] he does with new inhaler. FU in 6 months or sooner if needed.   Nocturnal hypoxia - ONO in 2016 showed baseline SpO2 92%, low SpO2 78%. Spent 52 min with SpO2 < 88%. - Continues to benefit from 2L supplemental oxygen at night. No daytime requirements.   40 mins spent on case: >50% face to face with patient   EMartyn Ehrich NP 04/26/2022

## 2022-04-28 ENCOUNTER — Telehealth: Payer: Self-pay | Admitting: Pulmonary Disease

## 2022-04-28 NOTE — Telephone Encounter (Signed)
Martyn Ehrich, NP  04/26/2022  3:19 PM EDT     Please let patient know CXR showed no active cardiopulmonary disease. Lungs clear     Called and spoke with pt letting him know the results of cxr and he verbalized understanding. Nothing further needed.

## 2022-04-28 NOTE — Progress Notes (Signed)
Reviewed and agree with assessment/plan.   Chesley Mires, MD [facility] Pulmonary/Critical Care 04/28/2022, 9:57 AM Pager:  [phone]

## 2022-05-03 ENCOUNTER — Telehealth: Payer: Self-pay | Admitting: Primary Care

## 2022-05-03 MED ORDER — TRELEGY ELLIPTA 100-62.5-25 MCG/ACT IN AEPB: 1.0000 | INHALATION_SPRAY | Freq: Every day | RESPIRATORY_TRACT | 6 refills | Status: DC

## 2022-05-03 NOTE — Telephone Encounter (Signed)
Called and spoke with patient, he states that the Trelegy 100 works better than the [employer] and would like a script sent into his pharmacy, [facility].  Advised I would send it in for him.  Nothing further needed.

## 2022-05-05 ENCOUNTER — Ambulatory Visit: Payer: Medicare Other | Admitting: Primary Care

## 2022-05-19 ENCOUNTER — Encounter (INDEPENDENT_AMBULATORY_CARE_PROVIDER_SITE_OTHER): Payer: Medicare Other | Admitting: Ophthalmology

## 2022-05-19 DIAGNOSIS — H43813 Vitreous degeneration, bilateral: Secondary | ICD-10-CM

## 2022-05-19 DIAGNOSIS — I10 Essential (primary) hypertension: Secondary | ICD-10-CM | POA: Diagnosis not present

## 2022-05-19 DIAGNOSIS — H353211 Exudative age-related macular degeneration, right eye, with active choroidal neovascularization: Secondary | ICD-10-CM

## 2022-05-19 DIAGNOSIS — H353121 Nonexudative age-related macular degeneration, left eye, early dry stage: Secondary | ICD-10-CM | POA: Diagnosis not present

## 2022-05-19 DIAGNOSIS — H35033 Hypertensive retinopathy, bilateral: Secondary | ICD-10-CM

## 2022-06-02 ENCOUNTER — Telehealth: Payer: Self-pay | Admitting: Primary Care

## 2022-06-02 NOTE — Telephone Encounter (Signed)
Patient is needing a prescription of Trelegy mailed to him so that he can send it to the manufacturer to get patient assistance. Please advise- call back number is [phone].

## 2022-06-03 MED ORDER — TRELEGY ELLIPTA 100-62.5-25 MCG/ACT IN AEPB: 1.0000 | INHALATION_SPRAY | Freq: Every day | RESPIRATORY_TRACT | 11 refills | Status: DC

## 2022-06-03 NOTE — Telephone Encounter (Signed)
Called and spoke with pt letting him know that we would place Rx for Trelegy in mail for him and he verbalized understanding. Rx printed out and signed by BW and placed in mail for pt. Nothing further needed.

## 2022-10-06 ENCOUNTER — Encounter (INDEPENDENT_AMBULATORY_CARE_PROVIDER_SITE_OTHER): Payer: Medicare Other | Admitting: Ophthalmology

## 2022-10-06 DIAGNOSIS — H43813 Vitreous degeneration, bilateral: Secondary | ICD-10-CM

## 2022-10-06 DIAGNOSIS — H353121 Nonexudative age-related macular degeneration, left eye, early dry stage: Secondary | ICD-10-CM

## 2022-10-06 DIAGNOSIS — I10 Essential (primary) hypertension: Secondary | ICD-10-CM | POA: Diagnosis not present

## 2022-10-06 DIAGNOSIS — H353211 Exudative age-related macular degeneration, right eye, with active choroidal neovascularization: Secondary | ICD-10-CM

## 2022-10-06 DIAGNOSIS — H35033 Hypertensive retinopathy, bilateral: Secondary | ICD-10-CM | POA: Diagnosis not present

## 2022-11-29 ENCOUNTER — Ambulatory Visit (HOSPITAL_BASED_OUTPATIENT_CLINIC_OR_DEPARTMENT_OTHER): Payer: Medicare Other | Admitting: Pulmonary Disease

## 2022-11-29 ENCOUNTER — Encounter (HOSPITAL_BASED_OUTPATIENT_CLINIC_OR_DEPARTMENT_OTHER): Payer: Self-pay | Admitting: Pulmonary Disease

## 2022-11-29 VITALS — BP 124/62 | HR 61 | Temp 98.0°F | Ht 69.0 in | Wt 230.2 lb

## 2022-11-29 DIAGNOSIS — J4489 Other specified chronic obstructive pulmonary disease: Secondary | ICD-10-CM | POA: Diagnosis not present

## 2022-11-29 DIAGNOSIS — G4734 Idiopathic sleep related nonobstructive alveolar hypoventilation: Secondary | ICD-10-CM

## 2022-11-29 DIAGNOSIS — R0609 Other forms of dyspnea: Secondary | ICD-10-CM | POA: Diagnosis not present

## 2022-11-29 NOTE — Progress Notes (Signed)
[place] [facility], [facility] Sleep Medicine  Chief Complaint  Patient presents with   Follow-up    Pt states he has been doing good since last visit and denies any complaints.    Past Surgical History:  He  has a past surgical history that includes Tonsillectomy and adenoidectomy; Vasectomy; Laparoscopic appendectomy; Cholecystectomy; Colonoscopy with propofol (N/A, 05/09/2019); and polypectomy (05/09/2019).  Past Medical History:  HLD, HTN, CAD s/p DES   Constitutional:  BP 124/62 (BP Location: Left Arm, Patient Position: Sitting, Cuff Size: Large)   Pulse 61   Temp 98 F (36.7 C) (Oral)   Ht '5\' 9"'$  (1.753 m)   Wt 230 lb 2.6 oz (104.4 kg)   SpO2 96% Comment: RA  BMI 33.99 kg/m   Brief Summary:  Hector Callery. is a 77 y.o. male former smoker with COPD and chronic respiratory failure.       Subjective:   I last saw him in 2021.  More recently seen by Geraldo Pitter.  Spirometry from August 2023 showed severe obstruction and chest xray from 04/26/22 was normal.  Trelegy has worked better than incruse.  He gets cough with phlegm in the morning after waking up.  He drinks pineapple juice and then feels better.  Does okay during the rest of the day.  Sleeping okay.  Uses oxygen at night.  Gets occasional ankle swelling.  Hasn't needed to use albuterol.  Gets winded walking up stairs or at a brisk pace.  Does okay at steady pace.   Physical Exam:   Appearance - well kempt   ENMT - no sinus tenderness, no oral exudate, no LAN, Mallampati 3 airway, no stridor  Respiratory - equal breath sounds bilaterally, no wheezing or rales  CV - s1s2 regular rate and rhythm, no murmurs  Ext - no clubbing, no edema  Skin - no rashes  Psych - normal mood and affect   Pulmonary testing:  Spirometry 11/25/13 >> FEV1 0.74, FEV1% 35  Ambulatory oximetry RA 01/09/14 >> SpO2 80% after 1 lap A1AT 01/09/14 >> 161, PI-MM Spirometry 04/26/22 >> FEV1 0.87 (30%), FEV1% 46  Chest  Imaging:  V/Q 09/13/12 >> airtrapping   Sleep Tests:  ONO with RA 03/03/15 >> test time 6 hrs 48 min.  Baseline SpO2 92%, low SpO2 78%. Spent 52 min with SpO2 < 88%.   Cardiac Tests:  Echo 10/08/12 >> mild LVH, EF 40 to 45%    Social History:  He  reports that he quit smoking about 44 years ago. His smoking use included cigarettes. He started smoking about 57 years ago. He has a 13.00 pack-year smoking history. He has never used smokeless tobacco. He reports current alcohol use. He reports that he does not use drugs.  Family History:  His family history includes Heart disease in his mother; Hyperlipidemia in his brother; Melanoma in his father.     Assessment/Plan:   COPD with chronic bronchitis. - continue trelegy 100 one puff daily - prn albuterol - discussed different roles for his inhalers  Vaccinations. - discussed CDC recommendation for elderly individuals with high risk health conditions to consider getting additional COVID booster during Spring of 2024   Chronic respiratory failure with hypoxia. - uses 2 liters oxygen at night  Dyspnea on exertion. - likely combination of COPD and decondition - discussed importance of maintaining a regular exercise regimen  Time Spent Involved in Patient Care on Day of Examination:    Follow up:   Patient Instructions  Follow up in 1 year in [place] office  Medication List:   Allergies as of 11/29/2022       Reactions   Crestor [rosuvastatin]    Right hip pain and weakness   Tessalon [benzonatate]    Weird feeling        Medication List        Accurate as of November 29, 2022  8:27 AM. If you have any questions, ask your nurse or doctor.          albuterol 108 (90 Base) MCG/ACT inhaler Commonly known as: VENTOLIN HFA inhale TWO puffs EVERY 6 HOURS AS NEEDED FOR wheezing OR SHORTNESS OF BREATH   amLODipine 5 MG tablet Commonly known as: NORVASC Take 5 mg by mouth daily.   aspirin EC 81 MG tablet Take 81 mg  by mouth daily. Swallow whole.   atorvastatin 40 MG tablet Commonly known as: LIPITOR Take 40 mg by mouth daily.   bevacizumab 1.25 mg/0.1 mL Soln Commonly known as: AVASTIN 1.25 mg by Intravitreal route every 30 (thirty) days.   cetirizine 10 MG tablet Commonly known as: ZYRTEC Take 10 mg by mouth daily.   clopidogrel 75 MG tablet Commonly known as: PLAVIX Take 75 mg by mouth daily.   famotidine 10 MG tablet Commonly known as: PEPCID Take 10 mg by mouth 2 (two) times daily.   hydrALAZINE 50 MG tablet Commonly known as: APRESOLINE Take 50 mg by mouth 3 (three) times daily.   metoprolol tartrate 50 MG tablet Commonly known as: LOPRESSOR Take 75 mg by mouth 2 (two) times daily.   multivitamin capsule Take 1 capsule by mouth daily.   OXYGEN Inhale 2 L into the lungs at bedtime.   PRESERVISION AREDS 2 PO Take 1 tablet by mouth 2 (two) times daily.   Trelegy Ellipta 100-62.5-25 MCG/ACT Aepb Generic drug: Fluticasone-Umeclidin-Vilant Inhale 1 puff into the lungs daily.   trimethoprim-polymyxin b ophthalmic solution Commonly known as: POLYTRIM Place 1 drop into the right eye 4 (four) times daily.   Vitamin D3 50 MCG (2000 UT) Caps Generic drug: Cholecalciferol Take 2,000 Units by mouth daily.        Signature:  Chesley Mires, MD [place] Pager - [phone] 11/29/2022, 8:27 AM

## 2022-11-29 NOTE — Patient Instructions (Signed)
Follow up in 1 year in [place] office

## 2023-02-16 ENCOUNTER — Other Ambulatory Visit: Payer: Self-pay | Admitting: Primary Care

## 2023-02-23 ENCOUNTER — Encounter (INDEPENDENT_AMBULATORY_CARE_PROVIDER_SITE_OTHER): Payer: Medicare Other | Admitting: Ophthalmology

## 2023-02-23 DIAGNOSIS — I10 Essential (primary) hypertension: Secondary | ICD-10-CM | POA: Diagnosis not present

## 2023-02-23 DIAGNOSIS — H43813 Vitreous degeneration, bilateral: Secondary | ICD-10-CM

## 2023-02-23 DIAGNOSIS — H353122 Nonexudative age-related macular degeneration, left eye, intermediate dry stage: Secondary | ICD-10-CM | POA: Diagnosis not present

## 2023-02-23 DIAGNOSIS — H353211 Exudative age-related macular degeneration, right eye, with active choroidal neovascularization: Secondary | ICD-10-CM

## 2023-02-23 DIAGNOSIS — H35033 Hypertensive retinopathy, bilateral: Secondary | ICD-10-CM | POA: Diagnosis not present

## 2023-05-30 ENCOUNTER — Other Ambulatory Visit (HOSPITAL_BASED_OUTPATIENT_CLINIC_OR_DEPARTMENT_OTHER): Payer: Self-pay

## 2023-05-30 ENCOUNTER — Telehealth: Payer: Self-pay | Admitting: Pulmonary Disease

## 2023-05-30 MED ORDER — TRELEGY ELLIPTA 100-62.5-25 MCG/ACT IN AEPB: 1.0000 | INHALATION_SPRAY | Freq: Every day | RESPIRATORY_TRACT | 6 refills | Status: DC

## 2023-05-30 NOTE — Progress Notes (Signed)
Rx printed and patient aware.

## 2023-05-30 NOTE — Telephone Encounter (Signed)
Spoke to patient who wants rx mailed to them. It has been put in mail to be sent out. Patient is aware.

## 2023-05-30 NOTE — Telephone Encounter (Signed)
Patient needs a copy of his medication refill prescription of trilogy so he can get it at a discounted rate from the manufacture.

## 2023-07-06 ENCOUNTER — Encounter (INDEPENDENT_AMBULATORY_CARE_PROVIDER_SITE_OTHER): Payer: Medicare Other | Admitting: Ophthalmology

## 2023-07-06 DIAGNOSIS — H353211 Exudative age-related macular degeneration, right eye, with active choroidal neovascularization: Secondary | ICD-10-CM

## 2023-07-06 DIAGNOSIS — I10 Essential (primary) hypertension: Secondary | ICD-10-CM

## 2023-07-06 DIAGNOSIS — H43813 Vitreous degeneration, bilateral: Secondary | ICD-10-CM

## 2023-07-06 DIAGNOSIS — H353121 Nonexudative age-related macular degeneration, left eye, early dry stage: Secondary | ICD-10-CM | POA: Diagnosis not present

## 2023-07-06 DIAGNOSIS — H35033 Hypertensive retinopathy, bilateral: Secondary | ICD-10-CM

## 2023-11-09 ENCOUNTER — Encounter (INDEPENDENT_AMBULATORY_CARE_PROVIDER_SITE_OTHER): Payer: Medicare Other | Admitting: Ophthalmology

## 2023-11-09 DIAGNOSIS — H35033 Hypertensive retinopathy, bilateral: Secondary | ICD-10-CM | POA: Diagnosis not present

## 2023-11-09 DIAGNOSIS — I10 Essential (primary) hypertension: Secondary | ICD-10-CM | POA: Diagnosis not present

## 2023-11-09 DIAGNOSIS — H353121 Nonexudative age-related macular degeneration, left eye, early dry stage: Secondary | ICD-10-CM | POA: Diagnosis not present

## 2023-11-09 DIAGNOSIS — H43813 Vitreous degeneration, bilateral: Secondary | ICD-10-CM

## 2023-11-09 DIAGNOSIS — H353211 Exudative age-related macular degeneration, right eye, with active choroidal neovascularization: Secondary | ICD-10-CM | POA: Diagnosis not present

## 2023-12-08 ENCOUNTER — Telehealth: Payer: Self-pay | Admitting: Primary Care

## 2023-12-08 NOTE — Telephone Encounter (Signed)
 I was just up front grabbing Hector Daniels's mail. There is not a form for this pt. Please do not send triage messages for forms if there is not a form in the mailbox. Thanks!

## 2023-12-08 NOTE — Telephone Encounter (Signed)
 No worries, we will advised this when it is received. NFN until then

## 2023-12-08 NOTE — Telephone Encounter (Signed)
 Hi Ashlyn- I'm sorry about this. The message indicates the PT will be sending it , not that it was in her box. I noted it to have a record of it in his chart. Next time I will be more clear.

## 2023-12-08 NOTE — Telephone Encounter (Signed)
 PT will be sending a handicap placard application to ms. Walsh's attention by mail. It will need her signature.

## 2023-12-12 ENCOUNTER — Telehealth: Payer: Self-pay

## 2023-12-12 ENCOUNTER — Telehealth: Payer: Self-pay | Admitting: Primary Care

## 2023-12-12 NOTE — Telephone Encounter (Signed)
 ATC pt x1. Lmtcb. His Disability parking placard is ready for pickup. Im am leaving this in an envelope with his name on it at my desk.

## 2023-12-12 NOTE — Telephone Encounter (Signed)
 Ready to be mailed. NFN

## 2023-12-12 NOTE — Telephone Encounter (Signed)
 Please see last signed encounter. PT would like [place] to mail the [facility] placard to him since he is 2 hrs away.   Confirmed his address as: [address] [place] [address]

## 2024-01-22 ENCOUNTER — Telehealth: Payer: Self-pay | Admitting: Primary Care

## 2024-01-22 ENCOUNTER — Other Ambulatory Visit (HOSPITAL_COMMUNITY): Payer: Self-pay

## 2024-01-22 ENCOUNTER — Ambulatory Visit: Admitting: Primary Care

## 2024-01-22 ENCOUNTER — Encounter: Payer: Self-pay | Admitting: Primary Care

## 2024-01-22 VITALS — BP 134/62 | HR 58 | Temp 97.7°F | Ht 68.0 in | Wt 228.8 lb

## 2024-01-22 DIAGNOSIS — G4734 Idiopathic sleep related nonobstructive alveolar hypoventilation: Secondary | ICD-10-CM

## 2024-01-22 DIAGNOSIS — J4489 Other specified chronic obstructive pulmonary disease: Secondary | ICD-10-CM | POA: Diagnosis not present

## 2024-01-22 MED ORDER — TRELEGY ELLIPTA 100-62.5-25 MCG/ACT IN AEPB: 1.0000 | INHALATION_SPRAY | Freq: Every day | RESPIRATORY_TRACT | Status: DC

## 2024-01-22 MED ORDER — TRELEGY ELLIPTA 100-62.5-25 MCG/ACT IN AEPB: 1.0000 | INHALATION_SPRAY | Freq: Every day | RESPIRATORY_TRACT | 11 refills | Status: AC

## 2024-01-22 NOTE — Progress Notes (Signed)
 @Patient  ID: Hector C Reinig Jr., male    DOB: August 25, 1946, 78 y.o.   MRN: [id number]  Chief Complaint  Patient presents with   Follow-up    COPD and Nocturnal Hypoxia    Referring provider: Avelina Bode, DO  HPI: 78 year old male, former smoker quit 1980 (13-pack-year history).  Past medical history significant for centrilobular emphysema, COPD, chronic respiratory failure (O2 dependent), hypertension, hyperlipidemia, coronary artery disease status post DES.  Patient of Dr. Matilde Son. Incruse changed to Anoro in April 2022.  Previous [facility] pulmonary encounter: 01/21/2021 Patient presents today for 13-month follow-up. He is doing well, reports that his shortness of breath is some worse with activity. He is trying to stay active. He recently starting going to the gym three days a week with [employer] group. He does his own errands and shopping. He will have dyspnea with strenuous activity such as climbing stairs or mowing lawn. He is compliant with Incruse and feels it works well but is wondering if there is a better inhaler. He is not using his rescue inhaler much, only time is when he is at the gym. CAT score 8. Denies cough, chest tightness, wheezing.   05/06/2021 Patient presents today for 4 month follow-up/ COPD. He is doing well, no acute complaints today. He noticed a significant benefit in his breathing after starting ANORO. States that he does not get out of breath as often. He is able to walk longer distances. Feels more comfortable. He is still going to the gym three days a week. He is not currently using daytime oxygen, continues 2L at night. Denies chest tightness, wheezing or cough.   10/28/2021 Patient presents today for 6 month follow-up. He is doing well today, no acute symptoms. He has noticed improvement in his breathing and endurance since starting Anoro back in April 2022. He rarely requires his Albuterol  rescue inhaler. He occasionally gets mucus to the back of his throat. He has some post  nasal drip in the morning. He continues to wear 2L oxygen at night. Denies f/c/s, shortness of breath, chest tightness, wheezing.   04/26/2022 Patient presents for 6 month follow-up.  He is doing well today.  Breathing is baseline.  No recent exacerbations or hospitalizations.  He states that he worsens dyspnea symptoms.  He gets winded bending over walking up stairs.  He often needs to clear his throat.  Cough is mainly nonproductive. He is complaint with Anoro. He wears 2L oxygen at bedtime. No daytime requirements. Last CXR and PFTs were 5 year ago.    01/22/2024- Interim hx  Discussed the use of AI scribe software for clinical note transcription with the patient, who gave verbal consent to proceed.  History of Present Illness   Hector C Devaul Jr. is a 78 year old male with COPD and nocturnal hypoxia who presents for a regular follow-up.  He uses oxygen at night at a rate of two liters and does not require oxygen during the day. He is currently on Trelegy 100mcg, which he finds more effective than his previous medication, Anoro, despite its higher cost. He has previously received manufacturer's assistance for Trelegy, obtaining a four-month supply after an initial out-of-pocket expense of $600.  His breathing has improved since the pollen levels have decreased. During the heavy pollen season, he experienced difficulty climbing hills, but this has since eased. He has not been hospitalized in the past six months and has not had any respiratory infections, pneumonia, or bronchitis requiring antibiotics or prednisone in the same  period.  He denies having a productive cough but mentions frequent throat clearing due to mucus, which he describes as 'stuck mucus'. He has not been using any specific medication for this symptom.  He remains active, attending exercise classes three days a week. No daytime oxygen requirements, productive cough, recent hospitalizations, respiratory infections, pneumonia, or  bronchitis in the last six months.      Pulmonary testing Spirometry 11/25/13 >> FEV1 0.74, FEV1% 35  Ambulatory oximetry RA 01/09/14 >> SpO2 80% after 1 lap A1AT 01/09/14 >> 161, PI-MM Spirometry 04/26/2022 >> FEV1 0.9 (30%)   Sleep test ONO with RA 03/03/15 >> test time 6 hrs 48 min.  Baseline SpO2 92%, low SpO2 78%. Spent 52 min with SpO2 < 88%.   Cardiac test Echo 10/08/12 >> mild LVH, EF 40 to 45%   Imaging: CXR 04/26/22>> Clear lungs. No active cardiopulmonary disease   Allergies  Allergen Reactions   Crestor [Rosuvastatin]     Right hip pain and weakness   Tessalon [Benzonatate]     Weird feeling     Immunization History  Administered Date(s) Administered   Fluad Quad(high Dose 65+) 07/16/2020, 06/10/2021   Influenza Split 07/04/2012, 06/26/2013, 07/07/2017   Influenza, High Dose Seasonal PF 06/26/2016, 06/26/2018   Influenza,inj,Quad PF,6+ Mos 06/13/2014, 06/27/2015   Influenza,inj,quad, With Preservative 06/28/2016   Influenza-Unspecified 08/05/2008, 06/17/2009, 07/01/2011, 07/31/2013, 06/29/2015, 06/29/2018, 06/26/2020   Moderna Sars-Covid-2 Vaccination 10/31/2019, 11/28/2019, 07/29/2020   Pneumococcal Conjugate-13 09/27/2011, 07/15/2015   Pneumococcal Polysaccharide-23 05/04/2011, 04/25/2019   Tdap 07/15/2015   Zoster Recombinant(Shingrix) 01/29/2018, 07/12/2018   Zoster, Live 07/31/2013, 06/26/2016    Past Medical History:  Diagnosis Date   Acute renal failure (HCC)    Episode 12/13, prerenal, ACE inhibitor and diuretic discontinued   COPD (chronic obstructive pulmonary disease) (HCC)    Coronary atherosclerosis of native coronary artery    Moderate multivessel, s/p DES x 2 RCA 2006   Essential hypertension, benign    History of hematuria    Mixed hyperlipidemia    Tubular adenoma     Tobacco History: Social History   Tobacco Use  Smoking Status Former   Current packs/day: 0.00   Average packs/day: 1 pack/day for 13.0 years (13.0 ttl pk-yrs)    Types: Cigarettes   Start date: 09/26/1965   Quit date: 09/26/1978   Years since quitting: 45.3  Smokeless Tobacco Never  Tobacco Comments   Quit x 20 years   Counseling given: Not Answered Tobacco comments: Quit x 20 years   Outpatient Medications Prior to Visit  Medication Sig Dispense Refill   albuterol  (VENTOLIN  HFA) 108 (90 Base) MCG/ACT inhaler inhale TWO puffs EVERY 6 HOURS AS NEEDED FOR wheezing OR SHORTNESS OF BREATH 8.5 g 2   amLODipine (NORVASC) 5 MG tablet Take 5 mg by mouth daily.     aspirin EC 81 MG tablet Take 81 mg by mouth daily. Swallow whole.     atorvastatin (LIPITOR) 40 MG tablet Take 40 mg by mouth daily.  2   bevacizumab  (AVASTIN ) 1.25 mg/0.1 mL SOLN 1.25 mg by Intravitreal route every 30 (thirty) days.      cetirizine (ZYRTEC) 10 MG tablet Take 10 mg by mouth daily.     Cholecalciferol (VITAMIN D3) 2000 UNITS capsule Take 2,000 Units by mouth daily.     clopidogrel (PLAVIX) 75 MG tablet Take 75 mg by mouth daily.     famotidine (PEPCID) 10 MG tablet Take 10 mg by mouth 2 (two) times daily.  Fluticasone-Umeclidin-Vilant (TRELEGY ELLIPTA ) 100-62.5-25 MCG/ACT AEPB Inhale 1 puff into the lungs daily. 60 each 6   hydrALAZINE  (APRESOLINE ) 50 MG tablet Take 50 mg by mouth 3 (three) times daily.      metoprolol (LOPRESSOR) 50 MG tablet Take 75 mg by mouth 2 (two) times daily.     Multiple Vitamin (MULTIVITAMIN) capsule Take 1 capsule by mouth daily.     Multiple Vitamins-Minerals (PRESERVISION AREDS 2 PO) Take 1 tablet by mouth 2 (two) times daily.      OXYGEN Inhale 2 L into the lungs at bedtime.      trimethoprim-polymyxin b (POLYTRIM) ophthalmic solution Place 1 drop into the right eye 4 (four) times daily.      No facility-administered medications prior to visit.   Review of Systems  Review of Systems  Constitutional: Negative.   HENT:  Positive for congestion.   Respiratory: Negative.    Cardiovascular: Negative.    Physical Exam  BP 134/62 (BP  Location: Left Arm, Patient Position: Sitting, Cuff Size: Normal)   Pulse (!) 58   Temp 97.7 F (36.5 C) (Temporal)   Ht 5\' 8"  (1.727 m)   Wt 228 lb 12.8 oz (103.8 kg)   SpO2 97%   BMI 34.79 kg/m  Physical Exam Constitutional:      Appearance: Normal appearance.  HENT:     Head: Normocephalic and atraumatic.     Mouth/Throat:     Mouth: Mucous membranes are moist.     Pharynx: Oropharynx is clear.  Cardiovascular:     Rate and Rhythm: Normal rate and regular rhythm.  Pulmonary:     Effort: Pulmonary effort is normal.     Breath sounds: Normal breath sounds. No wheezing or rhonchi.  Skin:    General: Skin is warm and dry.  Neurological:     General: No focal deficit present.     Mental Status: He is alert and oriented to person, place, and time. Mental status is at baseline.  Psychiatric:        Mood and Affect: Mood normal.        Behavior: Behavior normal.        Thought Content: Thought content normal.        Judgment: Judgment normal.      Lab Results:  CBC    Component Value Date/Time   WBC 10.2 01/09/2014 1601   RBC 4.98 01/09/2014 1601   HGB 14.6 01/09/2014 1601   HCT 43.5 01/09/2014 1601   PLT 198.0 01/09/2014 1601   MCV 87.3 01/09/2014 1601   MCHC 33.7 01/09/2014 1601   RDW 13.3 01/09/2014 1601    BMET    Component Value Date/Time   NA 139 01/09/2014 1601   K 4.6 01/09/2014 1601   CL 98 01/09/2014 1601   CO2 34 (H) 01/09/2014 1601   GLUCOSE 101 (H) 01/09/2014 1601   BUN 15 01/09/2014 1601   CREATININE 0.9 01/09/2014 1601   CALCIUM 9.1 01/09/2014 1601    BNP No results found for: "BNP"  ProBNP No results found for: "PROBNP"  Imaging: No results found.   Assessment & Plan:   1. COPD with chronic bronchitis (HCC) (Primary)  2. Nocturnal hypoxia  Assessment and Plan    Chronic Obstructive Pulmonary Disease (COPD) COPD well-managed with Trelegy inhaler. Symptoms improved with reduced pollen. No recent hospital admissions or  exacerbations. No significant productive cough or wheezing. Mucus sensation likely due to postnasal drip. - Provide Trelegy 100mcg inhaler samples and patient assistance paperwork. -  Consult pharmacy for formulary options. - Recommend OTC Mucinex (guaifenesin) 600mg  1-2 times a day for mucus thinning as needed. - Advise hydration to thin mucus. - Suggest OTC saline nasal spray or Flonase for postnasal drip.  Nocturnal Hypoxia Managed with 2 liters of nocturnal oxygen.      Antonio Baumgarten, NP 01/22/2024

## 2024-01-22 NOTE — Telephone Encounter (Signed)
 What is a formulary option for Trelegy

## 2024-01-22 NOTE — Patient Instructions (Signed)
 No changes today Continue Trelegy 100 one puff daily (samples given and paper work for patient assistance) Take regular mucinex 600mg  1-2 times a day to loosen congestion Try over the counter ocean saline nasal spray and/or flonase for post nasal drip  Stay active and continues routine exercising   Follow-up 1 year with new MD (former Dr. Matilde Son- 30 mins)

## 2024-01-22 NOTE — Telephone Encounter (Signed)
 Unable to run a benefits investigation due to the Trelegy being currently filled at the patients pharmacy.

## 2024-03-21 ENCOUNTER — Encounter (INDEPENDENT_AMBULATORY_CARE_PROVIDER_SITE_OTHER): Payer: Medicare Other | Admitting: Ophthalmology

## 2024-03-21 DIAGNOSIS — H35033 Hypertensive retinopathy, bilateral: Secondary | ICD-10-CM

## 2024-03-21 DIAGNOSIS — H353121 Nonexudative age-related macular degeneration, left eye, early dry stage: Secondary | ICD-10-CM

## 2024-03-21 DIAGNOSIS — I10 Essential (primary) hypertension: Secondary | ICD-10-CM | POA: Diagnosis not present

## 2024-03-21 DIAGNOSIS — H43813 Vitreous degeneration, bilateral: Secondary | ICD-10-CM

## 2024-03-21 DIAGNOSIS — H353211 Exudative age-related macular degeneration, right eye, with active choroidal neovascularization: Secondary | ICD-10-CM | POA: Diagnosis not present

## 2024-06-24 ENCOUNTER — Telehealth: Payer: Self-pay | Admitting: *Deleted

## 2024-06-24 ENCOUNTER — Other Ambulatory Visit: Payer: Self-pay

## 2024-06-24 MED ORDER — TRELEGY ELLIPTA 100-62.5-25 MCG/ACT IN AEPB: 1.0000 | INHALATION_SPRAY | Freq: Every day | RESPIRATORY_TRACT | 3 refills | Status: AC

## 2024-06-24 NOTE — Telephone Encounter (Signed)
 Copied from CRM [id number]. Topic: Clinical - Medication Question >> Jun 21, 2024  3:55 PM Hector Daniels wrote: Reason for CRM: Patient is requesting Daniels paper prescription of Fluticasone-Umeclidin-Vilant (TRELEGY ELLIPTA ) 100-62.5-25 MCG/ACT AEPB to send to the manufacturer - requesting Daniels mailed copy to the address on file (verified with patient).  Callback number: [phone]  Printed rx for trelegy signed and mailed to the pt  I spoke with him and notified this was done  Nothing further needed

## 2024-08-08 ENCOUNTER — Encounter (INDEPENDENT_AMBULATORY_CARE_PROVIDER_SITE_OTHER): Admitting: Ophthalmology

## 2024-08-08 DIAGNOSIS — H353211 Exudative age-related macular degeneration, right eye, with active choroidal neovascularization: Secondary | ICD-10-CM | POA: Diagnosis not present

## 2024-08-08 DIAGNOSIS — H35033 Hypertensive retinopathy, bilateral: Secondary | ICD-10-CM

## 2024-08-08 DIAGNOSIS — I10 Essential (primary) hypertension: Secondary | ICD-10-CM

## 2024-08-08 DIAGNOSIS — H353121 Nonexudative age-related macular degeneration, left eye, early dry stage: Secondary | ICD-10-CM

## 2024-08-08 DIAGNOSIS — H43813 Vitreous degeneration, bilateral: Secondary | ICD-10-CM

## 2024-10-29 ENCOUNTER — Telehealth: Payer: Self-pay | Admitting: *Deleted

## 2024-10-30 ENCOUNTER — Telehealth: Payer: Self-pay

## 2024-10-30 NOTE — Telephone Encounter (Signed)
 Copied from CRM [id number]. Topic: Appointments - Transfer of Care >> Oct 30, 2024  9:29 AM [name] wrote: Pt is requesting to transfer FROM: [name] Pt is requesting to transfer TO: [name] NP Reason for requested transfer: prefers  [name] It is the responsibility of the team the patient would like to transfer to [name] NP) to reach out to the patient if for any reason this transfer is not acceptable.   Patient has already been scheduled

## 2024-12-04 ENCOUNTER — Encounter: Admitting: Primary Care

## 2025-01-02 ENCOUNTER — Encounter (INDEPENDENT_AMBULATORY_CARE_PROVIDER_SITE_OTHER): Admitting: Ophthalmology

## 2025-01-27 ENCOUNTER — Encounter: Admitting: Internal Medicine
